# Patient Record
Sex: Female | Born: 1940
Health system: Southern US, Community
[De-identification: ages and names within clinical notes are randomized; demographics above are authoritative.]

## PROBLEM LIST (undated history)

## (undated) DIAGNOSIS — G43019 Migraine without aura, intractable, without status migrainosus: Principal | ICD-10-CM

## (undated) DIAGNOSIS — L80 Vitiligo: Secondary | ICD-10-CM

## (undated) DIAGNOSIS — I10 Essential (primary) hypertension: Secondary | ICD-10-CM

## (undated) DIAGNOSIS — K219 Gastro-esophageal reflux disease without esophagitis: Secondary | ICD-10-CM

## (undated) DIAGNOSIS — R609 Edema, unspecified: Secondary | ICD-10-CM

## (undated) DIAGNOSIS — N200 Calculus of kidney: Secondary | ICD-10-CM

## (undated) DIAGNOSIS — I498 Other specified cardiac arrhythmias: Secondary | ICD-10-CM

## (undated) DIAGNOSIS — E7800 Pure hypercholesterolemia, unspecified: Secondary | ICD-10-CM

## (undated) DIAGNOSIS — E119 Type 2 diabetes mellitus without complications: Secondary | ICD-10-CM

## (undated) HISTORY — DX: Edema, unspecified: R60.9

## (undated) HISTORY — DX: Vitiligo: L80

## (undated) HISTORY — DX: Calculus of kidney: N20.0

## (undated) HISTORY — DX: Type 2 diabetes mellitus without complications: E11.9

## (undated) HISTORY — PX: TONSILLECTOMY: SUR1361

## (undated) HISTORY — DX: Migraine without aura, intractable, without status migrainosus: G43.019

## (undated) HISTORY — PX: NASAL SINUS SURGERY: SHX719

## (undated) HISTORY — PX: BACK SURGERY: SHX140

## (undated) HISTORY — PX: ABDOMINAL HYSTERECTOMY: SHX81

## (undated) HISTORY — DX: Gastro-esophageal reflux disease without esophagitis: K21.9

## (undated) HISTORY — DX: Other specified cardiac arrhythmias: I49.8

---

## 1999-07-14 ENCOUNTER — Encounter: Admission: RE | Admit: 1999-07-14 | Discharge: 1999-10-12 | Payer: Self-pay | Admitting: Internal Medicine

## 2001-02-28 ENCOUNTER — Other Ambulatory Visit: Admission: RE | Admit: 2001-02-28 | Discharge: 2001-02-28 | Payer: Self-pay | Admitting: Dermatology

## 2001-08-03 ENCOUNTER — Ambulatory Visit (HOSPITAL_COMMUNITY): Admission: RE | Admit: 2001-08-03 | Discharge: 2001-08-03 | Payer: Self-pay | Admitting: Internal Medicine

## 2001-08-03 ENCOUNTER — Encounter: Payer: Self-pay | Admitting: Internal Medicine

## 2002-08-14 ENCOUNTER — Ambulatory Visit (HOSPITAL_COMMUNITY): Admission: RE | Admit: 2002-08-14 | Discharge: 2002-08-14 | Payer: Self-pay | Admitting: General Surgery

## 2002-09-20 ENCOUNTER — Encounter: Payer: Self-pay | Admitting: Internal Medicine

## 2002-09-20 ENCOUNTER — Ambulatory Visit (HOSPITAL_COMMUNITY): Admission: RE | Admit: 2002-09-20 | Discharge: 2002-09-20 | Payer: Self-pay | Admitting: Internal Medicine

## 2003-09-24 ENCOUNTER — Ambulatory Visit (HOSPITAL_COMMUNITY): Admission: RE | Admit: 2003-09-24 | Discharge: 2003-09-24 | Payer: Self-pay | Admitting: Internal Medicine

## 2004-09-24 ENCOUNTER — Ambulatory Visit (HOSPITAL_COMMUNITY): Admission: RE | Admit: 2004-09-24 | Discharge: 2004-09-24 | Payer: Self-pay | Admitting: Internal Medicine

## 2005-10-05 ENCOUNTER — Ambulatory Visit (HOSPITAL_COMMUNITY): Admission: RE | Admit: 2005-10-05 | Discharge: 2005-10-05 | Payer: Self-pay | Admitting: Internal Medicine

## 2005-11-22 ENCOUNTER — Emergency Department (HOSPITAL_COMMUNITY): Admission: EM | Admit: 2005-11-22 | Discharge: 2005-11-22 | Payer: Self-pay | Admitting: Emergency Medicine

## 2006-12-27 ENCOUNTER — Ambulatory Visit (HOSPITAL_COMMUNITY): Admission: RE | Admit: 2006-12-27 | Discharge: 2006-12-27 | Payer: Self-pay | Admitting: Internal Medicine

## 2007-12-28 ENCOUNTER — Ambulatory Visit (HOSPITAL_COMMUNITY): Admission: RE | Admit: 2007-12-28 | Discharge: 2007-12-28 | Payer: Self-pay | Admitting: Internal Medicine

## 2008-12-27 ENCOUNTER — Ambulatory Visit (HOSPITAL_COMMUNITY): Admission: RE | Admit: 2008-12-27 | Discharge: 2008-12-27 | Payer: Self-pay | Admitting: Internal Medicine

## 2008-12-31 ENCOUNTER — Ambulatory Visit (HOSPITAL_COMMUNITY): Admission: RE | Admit: 2008-12-31 | Discharge: 2008-12-31 | Payer: Self-pay | Admitting: Internal Medicine

## 2009-02-22 ENCOUNTER — Ambulatory Visit (HOSPITAL_COMMUNITY): Admission: RE | Admit: 2009-02-22 | Discharge: 2009-02-22 | Payer: Self-pay | Admitting: Internal Medicine

## 2009-05-04 HISTORY — PX: COLONOSCOPY: SHX174

## 2009-05-28 ENCOUNTER — Ambulatory Visit (HOSPITAL_COMMUNITY): Admission: RE | Admit: 2009-05-28 | Discharge: 2009-05-28 | Payer: Self-pay | Admitting: General Surgery

## 2010-02-17 ENCOUNTER — Ambulatory Visit (HOSPITAL_COMMUNITY): Admission: RE | Admit: 2010-02-17 | Discharge: 2010-02-17 | Payer: Self-pay | Admitting: Internal Medicine

## 2010-07-20 LAB — GLUCOSE, CAPILLARY: Glucose-Capillary: 98 mg/dL (ref 70–99)

## 2010-09-19 NOTE — H&P (Signed)
   NAMEARNETTE, DRIGGS                             ACCOUNT NO.:  [id number]   MEDICAL RECORD NO.:  [id number]                  PATIENT TYPE:   LOCATION:                                       FACILITY:   PHYSICIAN:  Dalia Heading, M.D.               DATE OF BIRTH:  06-17-1940   DATE OF ADMISSION:  DATE OF DISCHARGE:                                HISTORY & PHYSICAL   CHIEF COMPLAINT:  Need for endoscopic evaluation.   HISTORY OF PRESENT ILLNESS:  The patient is a 70 year old white female who  is referred for endoscopic evaluation.  She needs a follow up colonoscopy  due to a strong family history of colon carcinoma.  She has had intermittent  hematemesis recently.  She denies any lightheadedness, weight loss, fever,  abdominal pain, constipation, diarrhea, or melena.  She also complains of  dysphagia. It occurs intermittently and she is having problems swallowing  food initially in the morning.  She has had emesis of undigested food. No  heartburn has been noted. There is no history of peptic ulcer disease.  Her  last colonoscopy was in 1998.   PAST MEDICAL HISTORY:  Hypertension, extrinsic allergies.   PAST SURGICAL HISTORY:  Hysterectomy.   CURRENT MEDICATIONS:  Altace, furosemide, Premarin, Zyrtec, baby aspirin,  eye drops.   ALLERGIES:  No known drug allergies.   REVIEW OF SYSTEMS:  Noncontributory.   PHYSICAL EXAMINATION:  GENERAL:  On physical examination, the patient is a  well-developed well-nourished white female in no acute distress.  VITAL SIGNS:  She is afebrile and vital signs are stable.  NECK:  Examination is supple without thyromegaly or lymphadenopathy.  LUNGS:  Clear to auscultation with equal breath sounds bilaterally.  HEART:  Heart examination reveals a regular rate and rhythm without S3, S4,  or murmurs.  ABDOMEN:  The abdomen is soft, nontender, nondistended.  No  hepatosplenomegaly, masses are noted.  RECTAL:  Examination was deferred to the  procedure.   IMPRESSION:  1. Dysphagia.  2. Family history of colon carcinoma.    PLAN:  The patient is scheduled for an EGD and colonoscopy on 08/14/2002.  The risks and benefits of the procedure including bleeding and perforation  were fully explained to the patient who gave informed consent.                                               Dalia Heading, M.D.    MAJ/MEDQ  D:  08/08/2002  T:  08/08/2002  Job:  [id number]   cc:   Kingsley Callander. Ouida Sills, M.D.  [address]  [place]  [place] [address]  Fax: [phone]

## 2011-02-12 ENCOUNTER — Other Ambulatory Visit: Payer: Self-pay | Admitting: Obstetrics and Gynecology

## 2011-02-12 DIAGNOSIS — Z139 Encounter for screening, unspecified: Secondary | ICD-10-CM

## 2011-02-24 ENCOUNTER — Ambulatory Visit (HOSPITAL_COMMUNITY)
Admission: RE | Admit: 2011-02-24 | Discharge: 2011-02-24 | Disposition: A | Discharge status: Home / Self Care | Payer: Medicare Other | Source: Ambulatory Visit | Attending: Obstetrics and Gynecology | Admitting: Obstetrics and Gynecology

## 2011-02-24 DIAGNOSIS — Z1231 Encounter for screening mammogram for malignant neoplasm of breast: Secondary | ICD-10-CM | POA: Insufficient documentation

## 2011-02-24 DIAGNOSIS — Z139 Encounter for screening, unspecified: Secondary | ICD-10-CM

## 2011-06-08 DIAGNOSIS — E119 Type 2 diabetes mellitus without complications: Secondary | ICD-10-CM | POA: Diagnosis not present

## 2011-06-16 DIAGNOSIS — I10 Essential (primary) hypertension: Secondary | ICD-10-CM | POA: Diagnosis not present

## 2011-06-16 DIAGNOSIS — E119 Type 2 diabetes mellitus without complications: Secondary | ICD-10-CM | POA: Diagnosis not present

## 2011-07-06 DIAGNOSIS — H2510 Age-related nuclear cataract, unspecified eye: Secondary | ICD-10-CM | POA: Diagnosis not present

## 2011-07-06 DIAGNOSIS — E119 Type 2 diabetes mellitus without complications: Secondary | ICD-10-CM | POA: Diagnosis not present

## 2011-07-06 DIAGNOSIS — H4011X0 Primary open-angle glaucoma, stage unspecified: Secondary | ICD-10-CM | POA: Diagnosis not present

## 2011-09-25 DIAGNOSIS — Z79899 Other long term (current) drug therapy: Secondary | ICD-10-CM | POA: Diagnosis not present

## 2011-09-25 DIAGNOSIS — E119 Type 2 diabetes mellitus without complications: Secondary | ICD-10-CM | POA: Diagnosis not present

## 2011-09-25 DIAGNOSIS — E785 Hyperlipidemia, unspecified: Secondary | ICD-10-CM | POA: Diagnosis not present

## 2011-09-25 DIAGNOSIS — I10 Essential (primary) hypertension: Secondary | ICD-10-CM | POA: Diagnosis not present

## 2011-10-05 DIAGNOSIS — E785 Hyperlipidemia, unspecified: Secondary | ICD-10-CM | POA: Diagnosis not present

## 2011-10-05 DIAGNOSIS — E119 Type 2 diabetes mellitus without complications: Secondary | ICD-10-CM | POA: Diagnosis not present

## 2011-10-05 DIAGNOSIS — I498 Other specified cardiac arrhythmias: Secondary | ICD-10-CM | POA: Diagnosis not present

## 2011-10-05 DIAGNOSIS — R319 Hematuria, unspecified: Secondary | ICD-10-CM | POA: Diagnosis not present

## 2011-10-16 DIAGNOSIS — N390 Urinary tract infection, site not specified: Secondary | ICD-10-CM | POA: Diagnosis not present

## 2011-11-09 DIAGNOSIS — H2510 Age-related nuclear cataract, unspecified eye: Secondary | ICD-10-CM | POA: Diagnosis not present

## 2011-11-09 DIAGNOSIS — E119 Type 2 diabetes mellitus without complications: Secondary | ICD-10-CM | POA: Diagnosis not present

## 2011-11-09 DIAGNOSIS — H4011X0 Primary open-angle glaucoma, stage unspecified: Secondary | ICD-10-CM | POA: Diagnosis not present

## 2012-01-29 DIAGNOSIS — Z79899 Other long term (current) drug therapy: Secondary | ICD-10-CM | POA: Diagnosis not present

## 2012-01-29 DIAGNOSIS — E039 Hypothyroidism, unspecified: Secondary | ICD-10-CM | POA: Diagnosis not present

## 2012-01-29 DIAGNOSIS — E119 Type 2 diabetes mellitus without complications: Secondary | ICD-10-CM | POA: Diagnosis not present

## 2012-02-05 DIAGNOSIS — E119 Type 2 diabetes mellitus without complications: Secondary | ICD-10-CM | POA: Diagnosis not present

## 2012-02-05 DIAGNOSIS — I10 Essential (primary) hypertension: Secondary | ICD-10-CM | POA: Diagnosis not present

## 2012-03-14 DIAGNOSIS — H4011X0 Primary open-angle glaucoma, stage unspecified: Secondary | ICD-10-CM | POA: Diagnosis not present

## 2012-05-07 DIAGNOSIS — R300 Dysuria: Secondary | ICD-10-CM | POA: Diagnosis not present

## 2012-05-07 DIAGNOSIS — N390 Urinary tract infection, site not specified: Secondary | ICD-10-CM | POA: Diagnosis not present

## 2012-05-10 DIAGNOSIS — Z23 Encounter for immunization: Secondary | ICD-10-CM | POA: Diagnosis not present

## 2012-06-06 DIAGNOSIS — E119 Type 2 diabetes mellitus without complications: Secondary | ICD-10-CM | POA: Diagnosis not present

## 2012-06-14 DIAGNOSIS — E119 Type 2 diabetes mellitus without complications: Secondary | ICD-10-CM | POA: Diagnosis not present

## 2012-06-14 DIAGNOSIS — I10 Essential (primary) hypertension: Secondary | ICD-10-CM | POA: Diagnosis not present

## 2012-10-04 DIAGNOSIS — Z79899 Other long term (current) drug therapy: Secondary | ICD-10-CM | POA: Diagnosis not present

## 2012-10-04 DIAGNOSIS — I10 Essential (primary) hypertension: Secondary | ICD-10-CM | POA: Diagnosis not present

## 2012-10-04 DIAGNOSIS — E785 Hyperlipidemia, unspecified: Secondary | ICD-10-CM | POA: Diagnosis not present

## 2012-10-04 DIAGNOSIS — E119 Type 2 diabetes mellitus without complications: Secondary | ICD-10-CM | POA: Diagnosis not present

## 2012-10-10 DIAGNOSIS — Z79899 Other long term (current) drug therapy: Secondary | ICD-10-CM | POA: Diagnosis not present

## 2012-10-10 DIAGNOSIS — E119 Type 2 diabetes mellitus without complications: Secondary | ICD-10-CM | POA: Diagnosis not present

## 2012-10-10 DIAGNOSIS — I10 Essential (primary) hypertension: Secondary | ICD-10-CM | POA: Diagnosis not present

## 2012-10-10 DIAGNOSIS — E785 Hyperlipidemia, unspecified: Secondary | ICD-10-CM | POA: Diagnosis not present

## 2012-10-17 DIAGNOSIS — I10 Essential (primary) hypertension: Secondary | ICD-10-CM | POA: Diagnosis not present

## 2012-10-17 DIAGNOSIS — I498 Other specified cardiac arrhythmias: Secondary | ICD-10-CM | POA: Diagnosis not present

## 2012-10-17 DIAGNOSIS — E119 Type 2 diabetes mellitus without complications: Secondary | ICD-10-CM | POA: Diagnosis not present

## 2012-10-17 DIAGNOSIS — E785 Hyperlipidemia, unspecified: Secondary | ICD-10-CM | POA: Diagnosis not present

## 2012-10-20 ENCOUNTER — Other Ambulatory Visit (HOSPITAL_COMMUNITY): Payer: Self-pay | Admitting: Internal Medicine

## 2012-10-20 DIAGNOSIS — Z139 Encounter for screening, unspecified: Secondary | ICD-10-CM

## 2012-10-25 ENCOUNTER — Ambulatory Visit (HOSPITAL_COMMUNITY)
Admission: RE | Admit: 2012-10-25 | Discharge: 2012-10-25 | Disposition: A | Discharge status: Home / Self Care | Payer: Medicare Other | Source: Ambulatory Visit | Attending: Internal Medicine | Admitting: Internal Medicine

## 2012-10-25 DIAGNOSIS — Z1231 Encounter for screening mammogram for malignant neoplasm of breast: Secondary | ICD-10-CM | POA: Diagnosis not present

## 2012-10-25 DIAGNOSIS — Z139 Encounter for screening, unspecified: Secondary | ICD-10-CM

## 2012-11-11 DIAGNOSIS — H4011X0 Primary open-angle glaucoma, stage unspecified: Secondary | ICD-10-CM | POA: Diagnosis not present

## 2012-11-11 DIAGNOSIS — E119 Type 2 diabetes mellitus without complications: Secondary | ICD-10-CM | POA: Diagnosis not present

## 2012-11-11 DIAGNOSIS — H2510 Age-related nuclear cataract, unspecified eye: Secondary | ICD-10-CM | POA: Diagnosis not present

## 2012-12-02 ENCOUNTER — Other Ambulatory Visit: Payer: Self-pay | Admitting: *Deleted

## 2012-12-02 MED ORDER — CLOBETASOL PROPIONATE 0.05 % EX CREA: TOPICAL_CREAM | CUTANEOUS | Status: DC

## 2013-02-15 DIAGNOSIS — E119 Type 2 diabetes mellitus without complications: Secondary | ICD-10-CM | POA: Diagnosis not present

## 2013-02-22 DIAGNOSIS — E119 Type 2 diabetes mellitus without complications: Secondary | ICD-10-CM | POA: Diagnosis not present

## 2013-02-22 DIAGNOSIS — I10 Essential (primary) hypertension: Secondary | ICD-10-CM | POA: Diagnosis not present

## 2013-02-22 DIAGNOSIS — Z23 Encounter for immunization: Secondary | ICD-10-CM | POA: Diagnosis not present

## 2013-03-15 DIAGNOSIS — H4011X0 Primary open-angle glaucoma, stage unspecified: Secondary | ICD-10-CM | POA: Diagnosis not present

## 2013-03-15 DIAGNOSIS — H2510 Age-related nuclear cataract, unspecified eye: Secondary | ICD-10-CM | POA: Diagnosis not present

## 2013-03-15 DIAGNOSIS — E119 Type 2 diabetes mellitus without complications: Secondary | ICD-10-CM | POA: Diagnosis not present

## 2013-08-04 DIAGNOSIS — H4011X0 Primary open-angle glaucoma, stage unspecified: Secondary | ICD-10-CM | POA: Diagnosis not present

## 2013-08-04 DIAGNOSIS — H2510 Age-related nuclear cataract, unspecified eye: Secondary | ICD-10-CM | POA: Diagnosis not present

## 2013-08-04 DIAGNOSIS — H5340 Unspecified visual field defects: Secondary | ICD-10-CM | POA: Diagnosis not present

## 2013-08-04 DIAGNOSIS — E119 Type 2 diabetes mellitus without complications: Secondary | ICD-10-CM | POA: Diagnosis not present

## 2013-08-20 ENCOUNTER — Encounter (HOSPITAL_COMMUNITY): Payer: Self-pay | Admitting: Emergency Medicine

## 2013-08-20 ENCOUNTER — Emergency Department (HOSPITAL_COMMUNITY)
Admission: EM | Admit: 2013-08-20 | Discharge: 2013-08-21 | Disposition: A | Discharge status: Home / Self Care | Payer: Medicare Other | Attending: Emergency Medicine | Admitting: Emergency Medicine

## 2013-08-20 DIAGNOSIS — F411 Generalized anxiety disorder: Secondary | ICD-10-CM | POA: Diagnosis not present

## 2013-08-20 DIAGNOSIS — I10 Essential (primary) hypertension: Secondary | ICD-10-CM | POA: Insufficient documentation

## 2013-08-20 DIAGNOSIS — R51 Headache: Secondary | ICD-10-CM | POA: Diagnosis not present

## 2013-08-20 DIAGNOSIS — E7800 Pure hypercholesterolemia, unspecified: Secondary | ICD-10-CM | POA: Insufficient documentation

## 2013-08-20 DIAGNOSIS — Z79899 Other long term (current) drug therapy: Secondary | ICD-10-CM | POA: Insufficient documentation

## 2013-08-20 DIAGNOSIS — G8929 Other chronic pain: Secondary | ICD-10-CM | POA: Insufficient documentation

## 2013-08-20 DIAGNOSIS — R519 Headache, unspecified: Secondary | ICD-10-CM

## 2013-08-20 HISTORY — DX: Essential (primary) hypertension: I10

## 2013-08-20 HISTORY — DX: Pure hypercholesterolemia, unspecified: E78.00

## 2013-08-20 MED ORDER — DEXAMETHASONE SODIUM PHOSPHATE 10 MG/ML IJ SOLN
10.0000 mg | Freq: Once | INTRAMUSCULAR | Status: AC
  Administered 2013-08-20: 10 mg via INTRAVENOUS
  Filled 2013-08-20: qty 1

## 2013-08-20 MED ORDER — METOCLOPRAMIDE HCL 5 MG/ML IJ SOLN
10.0000 mg | Freq: Once | INTRAMUSCULAR | Status: AC
  Administered 2013-08-20: 10 mg via INTRAVENOUS
  Filled 2013-08-20: qty 2

## 2013-08-20 MED ORDER — HYDROMORPHONE HCL PF 1 MG/ML IJ SOLN
0.5000 mg | Freq: Once | INTRAMUSCULAR | Status: AC
  Administered 2013-08-21: 0.5 mg via INTRAVENOUS
  Filled 2013-08-20: qty 1

## 2013-08-20 MED ORDER — DIPHENHYDRAMINE HCL 50 MG/ML IJ SOLN
25.0000 mg | Freq: Once | INTRAMUSCULAR | Status: AC
  Administered 2013-08-20: 25 mg via INTRAVENOUS
  Filled 2013-08-20: qty 1

## 2013-08-20 MED ORDER — SODIUM CHLORIDE 0.9 % IV BOLUS (SEPSIS)
1000.0000 mL | Freq: Once | INTRAVENOUS | Status: AC
  Administered 2013-08-20: 1000 mL via INTRAVENOUS

## 2013-08-20 MED ORDER — HYDROMORPHONE HCL PF 1 MG/ML IJ SOLN
0.5000 mg | Freq: Once | INTRAMUSCULAR | Status: AC
  Administered 2013-08-20: 0.5 mg via INTRAVENOUS
  Filled 2013-08-20: qty 1

## 2013-08-20 NOTE — ED Notes (Signed)
Pt c/o of a headache of and on since last week with some nausea. Pt given tramadol. Pt recent went to [place] from March 7th to March 23rd. Pt just says she feels terrible.

## 2013-08-20 NOTE — ED Provider Notes (Signed)
CSN: [id number]     Arrival date & time 08/20/13  2110 History   First MD Initiated Contact with Patient 08/20/13 2130     Chief Complaint  Patient presents with  . Headache     (Consider location/radiation/quality/duration/timing/severity/associated sxs/prior Treatment) HPI 73 year old female with history of chronic intermittent headaches typically gets to severe headaches every year with multiple mild headaches in between episodes of severe headaches; in March travel to [place] but was not sick there at all and did take malaria prophylaxis, before the plane ride back home the patient became anxious and generally weak and shaky like she typically does before flight because she does not like to fly in the plane ride was 18 hours and on the way home she developed a very mild headache during the flight without associated symptoms other than anxiety and general weakness like she typically has one she flies, since she has been home she has had multiple intermittent headaches lasting several hours at a time which were mild but over the last week has become more intense and she saw her doctor and was prescribed Ultram which was controlling the headaches but now today she developed a gradual onset headache for the last several hours the Ultram is not helping, she is no fever no trauma no confusion no rash no body aches no vomiting but does have some mild nausea today she is no diarrhea no cough no chest pain no shortness of breath no abdominal pain she is no change in speech vision swallowing or understanding she has no focal or lateralizing weakness numbness or incoordination and she has no vertigo. She has had vertigo in the past but does not have today and has not had it in the last month. This headache today is similar to the headaches that she gets about twice a year. Past Medical History  Diagnosis Date  . High cholesterol   . Hypertension    Past Surgical History  Procedure Laterality Date  .  Tonsillectomy    . Back surgery    . Nasal sinus surgery    . Abdominal hysterectomy     History reviewed. No pertinent family history. History  Substance Use Topics  . Smoking status: Never Smoker   . Smokeless tobacco: Not on file  . Alcohol Use: No   OB History   Grav Para Term Preterm Abortions TAB SAB Ect Mult Living                 Review of Systems 10 Systems reviewed and are negative for acute change except as noted in the HPI.   Allergies  Codeine  Home Medications   Prior to Admission medications   Medication Sig Start Date End Date Taking? Authorizing Provider  clobetasol cream (TEMOVATE) 0.05 % Use lightly as directed for vulvar irritation 12/02/12   Jonnie Kind, MD  furosemide (LASIX) 40 MG tablet Take 40 mg by mouth daily. 06/27/13   Historical Provider, MD  hydrochlorothiazide (HYDRODIURIL) 25 MG tablet Take 25 mg by mouth daily. 08/15/13   Historical Provider, MD  ramipril (ALTACE) 10 MG capsule Take 10 mg by mouth daily. 06/27/13   Historical Provider, MD  traMADol (ULTRAM) 50 MG tablet Take 50 mg by mouth every 4 (four) hours as needed. For pain 08/11/13   Historical Provider, MD   BP 148/65  Pulse 65  Temp(Src) 98.1 F (36.7 C) (Oral)  Resp 18  Ht 5\' 2"  (1.575 m)  Wt 198 lb (89.812 kg)  BMI 36.21 kg/m2  SpO2 93% Physical Exam  Nursing note and vitals reviewed. Constitutional:  Awake, alert, nontoxic appearance with baseline speech for patient.  HENT:  Head: Atraumatic.  Mouth/Throat: No oropharyngeal exudate.  Eyes: EOM are normal. Pupils are equal, round, and reactive to light. Right eye exhibits no discharge. Left eye exhibits no discharge.  No nystagmus; no vertigo; negative test of skew; head and posttest without saccade  Neck: Neck supple.  Cardiovascular: Normal rate and regular rhythm.   No murmur heard. Pulmonary/Chest: Effort normal and breath sounds normal. No stridor. No respiratory distress. She has no wheezes. She has no rales. She  exhibits no tenderness.  Abdominal: Soft. Bowel sounds are normal. She exhibits no mass. There is no tenderness. There is no rebound.  Musculoskeletal: She exhibits no tenderness.  Baseline ROM, moves extremities with no obvious new focal weakness.  Lymphadenopathy:    She has no cervical adenopathy.  Neurological:  Awake, alert, cooperative and aware of situation; motor strength bilaterally; sensation normal to light touch bilaterally; peripheral visual fields full to confrontation; no facial asymmetry; tongue midline; major cranial nerves appear intact; no pronator drift, normal finger to nose bilaterally, baseline gait without new ataxia.  Skin: No rash noted.  Psychiatric: She has a normal mood and affect.    ED Course  Procedures (including critical care time) Pt feels improved after observation and/or treatment in ED.Patient / Family / Caregiver informed of clinical course, understand medical decision-making process, and agree with plan. Labs Review Labs Reviewed - No data to display  Imagin  MDM   Final diagnoses:  Headache    I doubt any other EMC precluding discharge at this time including, but not necessarily limited to the following:SBI, SAH, CVA.    Babette Relic, MD 08/21/13 2151

## 2013-08-21 MED ORDER — OXYCODONE-ACETAMINOPHEN 5-325 MG PO TABS: 1.0000 | ORAL_TABLET | ORAL | Status: DC | PRN

## 2013-08-21 NOTE — Discharge Instructions (Signed)

## 2013-08-22 DIAGNOSIS — G43009 Migraine without aura, not intractable, without status migrainosus: Secondary | ICD-10-CM | POA: Diagnosis not present

## 2013-08-22 MED FILL — Oxycodone w/ Acetaminophen Tab 5-325 MG: ORAL | Qty: 6 | Status: AC

## 2013-08-24 ENCOUNTER — Emergency Department (HOSPITAL_COMMUNITY): Payer: Medicare Other

## 2013-08-24 ENCOUNTER — Encounter (HOSPITAL_COMMUNITY): Payer: Self-pay | Admitting: Emergency Medicine

## 2013-08-24 ENCOUNTER — Emergency Department (HOSPITAL_COMMUNITY)
Admission: EM | Admit: 2013-08-24 | Discharge: 2013-08-24 | Disposition: A | Discharge status: Home / Self Care | Payer: Medicare Other | Attending: Emergency Medicine | Admitting: Emergency Medicine

## 2013-08-24 DIAGNOSIS — Z862 Personal history of diseases of the blood and blood-forming organs and certain disorders involving the immune mechanism: Secondary | ICD-10-CM | POA: Insufficient documentation

## 2013-08-24 DIAGNOSIS — N390 Urinary tract infection, site not specified: Secondary | ICD-10-CM | POA: Insufficient documentation

## 2013-08-24 DIAGNOSIS — Z79899 Other long term (current) drug therapy: Secondary | ICD-10-CM | POA: Insufficient documentation

## 2013-08-24 DIAGNOSIS — I10 Essential (primary) hypertension: Secondary | ICD-10-CM | POA: Diagnosis not present

## 2013-08-24 DIAGNOSIS — R51 Headache: Secondary | ICD-10-CM | POA: Diagnosis not present

## 2013-08-24 DIAGNOSIS — R110 Nausea: Secondary | ICD-10-CM | POA: Insufficient documentation

## 2013-08-24 DIAGNOSIS — R519 Headache, unspecified: Secondary | ICD-10-CM

## 2013-08-24 DIAGNOSIS — Z8639 Personal history of other endocrine, nutritional and metabolic disease: Secondary | ICD-10-CM | POA: Insufficient documentation

## 2013-08-24 LAB — COMPREHENSIVE METABOLIC PANEL
ALK PHOS: 82 U/L (ref 39–117)
ALT: 19 U/L (ref 0–35)
AST: 21 U/L (ref 0–37)
Albumin: 4 g/dL (ref 3.5–5.2)
BILIRUBIN TOTAL: 0.6 mg/dL (ref 0.3–1.2)
BUN: 22 mg/dL (ref 6–23)
CO2: 32 meq/L (ref 19–32)
CREATININE: 0.85 mg/dL (ref 0.50–1.10)
Calcium: 9.9 mg/dL (ref 8.4–10.5)
Chloride: 98 mEq/L (ref 96–112)
GFR calc Af Amer: 76 mL/min — ABNORMAL LOW (ref >90)
GFR calc non Af Amer: 65 mL/min — ABNORMAL LOW (ref >90)
Glucose, Bld: 183 mg/dL — ABNORMAL HIGH (ref 70–99)
Potassium: 4 mEq/L (ref 3.7–5.3)
Sodium: 140 mEq/L (ref 137–147)
TOTAL PROTEIN: 7.3 g/dL (ref 6.0–8.3)

## 2013-08-24 LAB — URINALYSIS, ROUTINE W REFLEX MICROSCOPIC
BILIRUBIN URINE: NEGATIVE
Glucose, UA: NEGATIVE mg/dL
HGB URINE DIPSTICK: NEGATIVE
KETONES UR: NEGATIVE mg/dL
Nitrite: NEGATIVE
PH: 6 (ref 5.0–8.0)
Protein, ur: NEGATIVE mg/dL
Specific Gravity, Urine: 1.01 (ref 1.005–1.030)
UROBILINOGEN UA: 0.2 mg/dL (ref 0.0–1.0)

## 2013-08-24 LAB — CBC WITH DIFFERENTIAL/PLATELET
BASOS ABS: 0 10*3/uL (ref 0.0–0.1)
Basophils Relative: 0 % (ref 0–1)
EOS PCT: 0 % (ref 0–5)
Eosinophils Absolute: 0 10*3/uL (ref 0.0–0.7)
HCT: 50.4 % — ABNORMAL HIGH (ref 36.0–46.0)
Hemoglobin: 17.4 g/dL — ABNORMAL HIGH (ref 12.0–15.0)
LYMPHS ABS: 0.9 10*3/uL (ref 0.7–4.0)
Lymphocytes Relative: 10 % — ABNORMAL LOW (ref 12–46)
MCH: 30.3 pg (ref 26.0–34.0)
MCHC: 34.5 g/dL (ref 30.0–36.0)
MCV: 87.7 fL (ref 78.0–100.0)
MONO ABS: 0.1 10*3/uL (ref 0.1–1.0)
Monocytes Relative: 1 % — ABNORMAL LOW (ref 3–12)
NEUTROS ABS: 8.3 10*3/uL — AB (ref 1.7–7.7)
NEUTROS PCT: 89 % — AB (ref 43–77)
PLATELETS: 306 10*3/uL (ref 150–400)
RBC: 5.75 MIL/uL — ABNORMAL HIGH (ref 3.87–5.11)
RDW: 13.9 % (ref 11.5–15.5)
WBC: 9.3 10*3/uL (ref 4.0–10.5)

## 2013-08-24 LAB — URINE MICROSCOPIC-ADD ON

## 2013-08-24 MED ORDER — ONDANSETRON 4 MG PO TBDP: ORAL_TABLET | ORAL | Status: DC

## 2013-08-24 MED ORDER — KETOROLAC TROMETHAMINE 30 MG/ML IJ SOLN
30.0000 mg | Freq: Once | INTRAMUSCULAR | Status: AC
  Administered 2013-08-24: 30 mg via INTRAVENOUS
  Filled 2013-08-24: qty 1

## 2013-08-24 MED ORDER — CEPHALEXIN 500 MG PO CAPS: 500.0000 mg | ORAL_CAPSULE | Freq: Four times a day (QID) | ORAL | Status: DC

## 2013-08-24 MED ORDER — CEPHALEXIN 500 MG PO CAPS
500.0000 mg | ORAL_CAPSULE | Freq: Once | ORAL | Status: AC
  Administered 2013-08-24: 500 mg via ORAL
  Filled 2013-08-24: qty 1

## 2013-08-24 MED ORDER — HYDROMORPHONE HCL PF 1 MG/ML IJ SOLN
1.0000 mg | Freq: Once | INTRAMUSCULAR | Status: AC
  Administered 2013-08-24: 1 mg via INTRAVENOUS
  Filled 2013-08-24: qty 1

## 2013-08-24 MED ORDER — METOCLOPRAMIDE HCL 5 MG/ML IJ SOLN
10.0000 mg | Freq: Once | INTRAMUSCULAR | Status: AC
  Administered 2013-08-24: 10 mg via INTRAVENOUS
  Filled 2013-08-24: qty 2

## 2013-08-24 MED ORDER — DIPHENHYDRAMINE HCL 50 MG/ML IJ SOLN
25.0000 mg | Freq: Once | INTRAMUSCULAR | Status: AC
  Administered 2013-08-24: 25 mg via INTRAVENOUS
  Filled 2013-08-24: qty 1

## 2013-08-24 NOTE — Discharge Instructions (Signed)
Follow up with your md next week. °

## 2013-08-24 NOTE — ED Notes (Signed)
Pt given and encouraged to drink water

## 2013-08-24 NOTE — ED Provider Notes (Signed)
CSN: [id number]     Arrival date & time 08/24/13  1650 History  This chart was scribed for Maudry Diego, MD by Zettie Pho, ED Scribe. This patient was seen in room APA05/[facility]05 and the patient's care was started at 6:51 PM.    Chief Complaint  Patient presents with  . Headache   Patient is a 73 y.o. female presenting with headaches. The history is provided by the patient. No language interpreter was used.  Headache Pain location:  Generalized Onset quality:  Gradual Timing:  Constant Progression:  Worsening Chronicity:  Recurrent Relieved by:  Nothing Worsened by:  Nothing tried Ineffective treatments:  Prescription medications (Prednisone, Percocet, Tramadol) Associated symptoms: nausea   Associated symptoms: no abdominal pain, no back pain, no congestion, no cough, no diarrhea, no fatigue, no seizures and no sinus pressure    HPI Comments: Kimberly Miranda is a 73 y.o. female who presents to the Emergency Department complaining of a constant, diffuse headache with associated nausea onset 4 days ago that she states has been progressively worsening. She states that she followed up with her PCP for similar complaints and was given prednisone and Tramadol, but without relief of her symptoms. Patient was also seen here 5 days ago on 08/20/2013 for similar complaints and given Percocet, which she also states has not been effective at alleviating her symptoms. She states that she returned from a trip to [place] about a month ago and has been "feeling off" since then. Patient has an allergy to codeine. Patient has a history of hypercholesterolemia and HTN.   PCP- Dr. Asencion Noble  Past Medical History  Diagnosis Date  . High cholesterol   . Hypertension    Past Surgical History  Procedure Laterality Date  . Tonsillectomy    . Back surgery    . Nasal sinus surgery    . Abdominal hysterectomy     History reviewed. No pertinent family history. History  Substance Use Topics  . Smoking  status: Never Smoker   . Smokeless tobacco: Not on file  . Alcohol Use: No   OB History   Grav Para Term Preterm Abortions TAB SAB Ect Mult Living                 Review of Systems  Constitutional: Negative for appetite change and fatigue.  HENT: Negative for congestion, ear discharge and sinus pressure.   Eyes: Negative for discharge.  Respiratory: Negative for cough.   Cardiovascular: Negative for chest pain.  Gastrointestinal: Positive for nausea. Negative for abdominal pain and diarrhea.  Genitourinary: Negative for frequency and hematuria.  Musculoskeletal: Negative for back pain.  Skin: Negative for rash.  Neurological: Positive for headaches. Negative for seizures.  Psychiatric/Behavioral: Negative for hallucinations.      Allergies  Codeine  Home Medications   Prior to Admission medications   Medication Sig Start Date End Date Taking? Authorizing Provider  clobetasol cream (TEMOVATE) 0.05 % Use lightly as directed for vulvar irritation 12/02/12   Jonnie Kind, MD  furosemide (LASIX) 40 MG tablet Take 40 mg by mouth daily. 06/27/13   Historical Provider, MD  hydrochlorothiazide (HYDRODIURIL) 25 MG tablet Take 25 mg by mouth daily. 08/15/13   Historical Provider, MD  oxyCODONE-acetaminophen (PERCOCET) 5-325 MG per tablet Take 1 tablet by mouth every 4 (four) hours as needed for severe pain. Dispense to go. 08/21/13   Babette Relic, MD  ramipril (ALTACE) 10 MG capsule Take 10 mg by mouth daily. 06/27/13  Historical Provider, MD  traMADol (ULTRAM) 50 MG tablet Take 50 mg by mouth every 4 (four) hours as needed. For pain 08/11/13   Historical Provider, MD   Triage Vitals: BP 166/72  Pulse 64  Temp(Src) 98.1 F (36.7 C) (Oral)  Resp 16  Ht 5\' 2"  (1.575 m)  Wt 198 lb (89.812 kg)  BMI 36.21 kg/m2  SpO2 96%  Physical Exam  Constitutional: She is oriented to person, place, and time. She appears well-developed.  HENT:  Head: Normocephalic.  Eyes: Conjunctivae and EOM  are normal. No scleral icterus.  Neck: Neck supple. No thyromegaly present.  Cardiovascular: Normal rate and regular rhythm.  Exam reveals no gallop and no friction rub.   No murmur heard. Pulmonary/Chest: No stridor. She has no wheezes. She has no rales. She exhibits no tenderness.  Abdominal: She exhibits no distension. There is no tenderness. There is no rebound.  Musculoskeletal: Normal range of motion. She exhibits no edema.  Lymphadenopathy:    She has no cervical adenopathy.  Neurological: She is oriented to person, place, and time. She exhibits normal muscle tone. Coordination normal.  Skin: No rash noted. No erythema.  Psychiatric: She has a normal mood and affect. Her behavior is normal.    ED Course  Procedures (including critical care time)  DIAGNOSTIC STUDIES: Oxygen Saturation is 96% on room air, normal by my interpretation.    COORDINATION OF CARE: 6:55 PM- Ordered a head CT, CBC, CMP, and UA. Ordered Reglan, Toradol, and Benadryl to manage symptoms. Discussed treatment plan with patient at bedside and patient verbalized agreement.   9:30 PM- Patient reports feeling much better after receiving the medications. Discussed that lab results may indicate a bladder infection, but that CT results were normal. Will discharge patient with anti-nausea medication and antibiotics to manage symptoms. Advised patient to follow up with her PCP if symptoms do not improve in a few days with treatment. Discussed treatment plan with patient at bedside and patient verbalized agreement.    Labs Review Labs Reviewed  CBC WITH DIFFERENTIAL - Abnormal; Notable for the following:    RBC 5.75 (*)    Hemoglobin 17.4 (*)    HCT 50.4 (*)    Neutrophils Relative % 89 (*)    Neutro Abs 8.3 (*)    Lymphocytes Relative 10 (*)    Monocytes Relative 1 (*)    All other components within normal limits  COMPREHENSIVE METABOLIC PANEL - Abnormal; Notable for the following:    Glucose, Bld 183 (*)     GFR calc non Af Amer 65 (*)    GFR calc Af Amer 76 (*)    All other components within normal limits  URINALYSIS, ROUTINE W REFLEX MICROSCOPIC - Abnormal; Notable for the following:    Leukocytes, UA MODERATE (*)    All other components within normal limits  URINE MICROSCOPIC-ADD ON - Abnormal; Notable for the following:    Squamous Epithelial / LPF FEW (*)    Bacteria, UA FEW (*)    All other components within normal limits    Imaging Review Ct Head Wo Contrast  08/24/2013   CLINICAL DATA:  Headache x1 month  EXAM: CT HEAD WITHOUT CONTRAST  TECHNIQUE: Contiguous axial images were obtained from the base of the skull through the vertex without intravenous contrast.  COMPARISON:  CT SINUS LTD W/O CM dated 02/22/2009  FINDINGS: No acute intracranial hemorrhage. No focal mass lesion. No CT evidence of acute infarction. No midline shift or mass effect. No  hydrocephalus. Basilar cisterns are patent. There several subcortical white matter hypodensities. Paranasal sinuses and mastoid air cells are clear.  IMPRESSION: 1. Mild microvascular disease. 2. No acute findings.   Electronically Signed   By: Suzy Bouchard M.D.   On: 08/24/2013 19:58     EKG Interpretation None      MDM   Final diagnoses:  None    The chart was scribed for me under my direct supervision.  I personally performed the history, physical, and medical decision making and all procedures in the evaluation of this patient.Maudry Diego, MD 08/24/13 2141

## 2013-08-24 NOTE — ED Notes (Addendum)
Headache for over one month.  Nausea.  Seen here Sunday night. Family concerned because she just traveled from [place] 21 days ago.

## 2013-08-28 ENCOUNTER — Emergency Department (HOSPITAL_COMMUNITY): Payer: Medicare Other

## 2013-08-28 ENCOUNTER — Emergency Department (HOSPITAL_COMMUNITY)
Admission: EM | Admit: 2013-08-28 | Discharge: 2013-08-29 | Disposition: A | Discharge status: Home / Self Care | Payer: Medicare Other | Attending: Emergency Medicine | Admitting: Emergency Medicine

## 2013-08-28 ENCOUNTER — Encounter (HOSPITAL_COMMUNITY): Payer: Self-pay | Admitting: Emergency Medicine

## 2013-08-28 DIAGNOSIS — R51 Headache: Secondary | ICD-10-CM | POA: Insufficient documentation

## 2013-08-28 DIAGNOSIS — Z79899 Other long term (current) drug therapy: Secondary | ICD-10-CM | POA: Diagnosis not present

## 2013-08-28 DIAGNOSIS — R110 Nausea: Secondary | ICD-10-CM | POA: Diagnosis not present

## 2013-08-28 DIAGNOSIS — E119 Type 2 diabetes mellitus without complications: Secondary | ICD-10-CM | POA: Insufficient documentation

## 2013-08-28 DIAGNOSIS — Z872 Personal history of diseases of the skin and subcutaneous tissue: Secondary | ICD-10-CM | POA: Diagnosis not present

## 2013-08-28 DIAGNOSIS — I10 Essential (primary) hypertension: Secondary | ICD-10-CM | POA: Diagnosis not present

## 2013-08-28 DIAGNOSIS — R29818 Other symptoms and signs involving the nervous system: Secondary | ICD-10-CM | POA: Diagnosis not present

## 2013-08-28 DIAGNOSIS — Z87442 Personal history of urinary calculi: Secondary | ICD-10-CM | POA: Diagnosis not present

## 2013-08-28 DIAGNOSIS — G43019 Migraine without aura, intractable, without status migrainosus: Secondary | ICD-10-CM | POA: Diagnosis not present

## 2013-08-28 DIAGNOSIS — Z8719 Personal history of other diseases of the digestive system: Secondary | ICD-10-CM | POA: Diagnosis not present

## 2013-08-28 DIAGNOSIS — R5381 Other malaise: Secondary | ICD-10-CM | POA: Diagnosis not present

## 2013-08-28 DIAGNOSIS — R5383 Other fatigue: Secondary | ICD-10-CM | POA: Diagnosis not present

## 2013-08-28 DIAGNOSIS — Z792 Long term (current) use of antibiotics: Secondary | ICD-10-CM | POA: Insufficient documentation

## 2013-08-28 DIAGNOSIS — Z7982 Long term (current) use of aspirin: Secondary | ICD-10-CM | POA: Insufficient documentation

## 2013-08-28 DIAGNOSIS — R519 Headache, unspecified: Secondary | ICD-10-CM

## 2013-08-28 MED ORDER — OXYCODONE-ACETAMINOPHEN 5-325 MG PO TABS
1.0000 | ORAL_TABLET | Freq: Once | ORAL | Status: AC
  Administered 2013-08-29: 1 via ORAL
  Filled 2013-08-28: qty 1

## 2013-08-28 MED ORDER — OXYCODONE-ACETAMINOPHEN 5-325 MG PO TABS: 1.0000 | ORAL_TABLET | ORAL | Status: DC | PRN

## 2013-08-28 NOTE — ED Notes (Signed)
Patient c/o headaches starting March 21st the days she left [place]. Patient endorses nausea with headaches with one episode of vomiting today. Patient sts today and Thursday pain was the worst ever. Pt denies weakness to one side, slurred speech, dizziness, blurry vision, CP, SOB, abdominal pain and diarrhea. Patient laying in bed in hallway with daughter in chair at foot of bed, Patient able to speak full sentences without difficulty.

## 2013-08-28 NOTE — ED Notes (Signed)
PT LAST TOOK A PERCOCET AT 7092. TOOK ZOFRAN PRIOR TO COMING.

## 2013-08-28 NOTE — ED Provider Notes (Signed)
Complains of occipital headache onset 3-4 weeks ago. Grams of onset pain is worse with standing improved with lying supine. No visual changes no fever no trauma she's been treated herself with tramadol which initially helped the pain and subsequently with Percocet with partial relief. She has run out of Percocet. On Patient is alert Glasgow Coma Score 15 HEENT exam no facial asymmetry is normal. Fundi benign. Neck supple. Neurologic Glasgow Coma Score 15 motor strength 5 over 5 overall gait normal DTRs symmetric bilaterally at knee jerk ankle jerk and biceps toes downward going bilaterally  Orlie Dakin, MD 08/28/13 2352

## 2013-08-28 NOTE — ED Provider Notes (Signed)
CSN: [id number]     Arrival date & time 08/28/13  1812 History   First MD Initiated Contact with Patient 08/28/13 2028     Chief Complaint  Patient presents with  . Headache     (Consider location/radiation/quality/duration/timing/severity/associated sxs/prior Treatment) HPI Comments: Patient states, that she has had a global headache intermittently since arriving back from a tract of her, March 25th or so.  She's been seen 3 times for, headache.  She's been given a headache cocktail, twice, which states has helped her pain for 2-3, days, but then it returns, the last evaluation by Dr. Eddie Dibbles was 4, days ago, at [facility] at which time she had a CT scan of her head and lab work, all of which was normal.  She was then seen by her primary care physician, Dr. Ria Comment to refer her to neurology as well as obtain an outpatient MRI.  This has not been successful to this point in time. She states, when she changes position.  She has increased pain in her head and prefers lying flat.  Denies any visual disturbances.  She does report nausea, and when ambulating.  Feels, like she has to hold onto things, although she is not ataxic by observation   Patient is a 73 y.o. female presenting with headaches. The history is provided by the patient.  Headache Pain location:  Generalized Radiates to:  Does not radiate Severity currently:  9/10 Severity at highest:  10/10 Onset quality:  Gradual Timing:  Intermittent Progression:  Unchanged Chronicity:  Recurrent Similar to prior headaches: yes   Relieved by:  Nothing Ineffective treatments: percocet. Associated symptoms: loss of balance, myalgias and nausea   Associated symptoms: no back pain, no blurred vision, no dizziness, no ear pain, no pain, no fever, no focal weakness, no neck pain, no neck stiffness, no paresthesias, no photophobia, no sinus pressure, no syncope and no weakness   Nausea:    Severity:  Mild   Onset quality:  Unable to specify    Timing:  Intermittent Risk factors: no anger, no family hx of SAH, does not have insomnia and lifestyle not sedentary     Past Medical History  Diagnosis Date  . High cholesterol   . Hypertension    Past Surgical History  Procedure Laterality Date  . Tonsillectomy    . Back surgery    . Nasal sinus surgery    . Abdominal hysterectomy     No family history on file. History  Substance Use Topics  . Smoking status: Never Smoker   . Smokeless tobacco: Not on file  . Alcohol Use: No   OB History   Grav Para Term Preterm Abortions TAB SAB Ect Mult Living                 Review of Systems  Unable to perform ROS Constitutional: Negative for fever and chills.  HENT: Negative for ear discharge, ear pain, rhinorrhea, sinus pressure and sneezing.   Eyes: Negative for blurred vision, photophobia and pain.  Respiratory: Negative for shortness of breath.   Cardiovascular: Negative for chest pain and syncope.  Gastrointestinal: Positive for nausea.  Endocrine: Negative for polydipsia, polyphagia and polyuria.  Genitourinary: Negative for dysuria.  Musculoskeletal: Positive for myalgias. Negative for back pain, neck pain and neck stiffness.  Skin: Negative for rash and wound.  Neurological: Positive for headaches and loss of balance. Negative for dizziness, focal weakness and paresthesias.  All other systems reviewed and are negative.  Allergies  Codeine  Home Medications   Prior to Admission medications   Medication Sig Start Date End Date Taking? Authorizing Provider  aspirin EC 81 MG tablet Take 81 mg by mouth daily.    Historical Provider, MD  bimatoprost (LUMIGAN) 0.03 % ophthalmic solution Place 1 drop into both eyes at bedtime.    Historical Provider, MD  butalbital-aspirin-caffeine [facility]) 50-325-40 MG per tablet Take 1 tablet by mouth every 4 (four) hours as needed. For pain 08/24/13   Historical Provider, MD  cephALEXin (KEFLEX) 500 MG capsule Take 1 capsule (500  mg total) by mouth 4 (four) times daily. 08/24/13   Maudry Diego, MD  clobetasol cream (TEMOVATE) 0.05 % Use lightly as directed for vulvar irritation 12/02/12   Jonnie Kind, MD  CRANBERRY FRUIT PO Take 1 capsule by mouth daily.    Historical Provider, MD  furosemide (LASIX) 40 MG tablet Take 40 mg by mouth once a week.  06/27/13   Historical Provider, MD  hydrochlorothiazide (HYDRODIURIL) 25 MG tablet Take 25 mg by mouth daily. 08/15/13   Historical Provider, MD  ondansetron (ZOFRAN ODT) 4 MG disintegrating tablet 4mg  ODT q4 hours prn nausea/vomit 08/24/13   Maudry Diego, MD  oxyCODONE-acetaminophen (PERCOCET) 5-325 MG per tablet Take 1 tablet by mouth every 4 (four) hours as needed for severe pain. Dispense to go. 08/21/13   Babette Relic, MD  predniSONE (DELTASONE) 10 MG tablet Take 10 mg by mouth See admin instructions. Take 4 tablets for 2 days, then 3 tablets for 2 days, then 2 tablets for 2 days, then 1 tablet for 2 days then stop.    Historical Provider, MD  ramipril (ALTACE) 10 MG capsule Take 10 mg by mouth daily. 06/27/13   Historical Provider, MD   BP 152/69  Pulse 62  Temp(Src) 97.2 F (36.2 C) (Oral)  Resp 16  SpO2 96% Physical Exam  Constitutional: She appears well-developed and well-nourished.  HENT:  Head: Normocephalic.  Eyes: Pupils are equal, round, and reactive to light.  Cardiovascular: Normal rate and regular rhythm.   Pulmonary/Chest: Effort normal and breath sounds normal.  Abdominal: Soft. Bowel sounds are normal. She exhibits no distension. There is no tenderness.  Musculoskeletal: Normal range of motion. She exhibits no edema and no tenderness.  Neurological: She is alert. She has normal strength. She displays normal reflexes. No cranial nerve deficit or sensory deficit. Coordination and gait normal. GCS eye subscore is 4. GCS verbal subscore is 5. GCS motor subscore is 6. She displays no Babinski's sign on the right side. She displays Babinski's sign on the  left side.  Reflex Scores:      Patellar reflexes are 2+ on the right side and 2+ on the left side.      Achilles reflexes are 2+ on the right side and 2+ on the left side. Gait is steady.  There is no hesitancy in moving lower extremities.  There is no foot drop  Skin: No rash noted. No erythema. No pallor.    ED Course  Procedures (including critical care time) Labs Review Labs Reviewed - No data to display  Imaging Review Mr Brain Wo Contrast  08/28/2013   CLINICAL DATA:  HEADACHE  EXAM: MRI HEAD WITHOUT CONTRAST  TECHNIQUE: Multiplanar, multiecho pulse sequences of the brain and surrounding structures were obtained without intravenous contrast.  COMPARISON:  CT HEAD W/O CM dated 08/24/2013  FINDINGS: No reduced diffusion to suggest acute ischemia. There is susceptibility artifact to suggest  hemorrhage.  The ventricles and sulci are normal. A few scattered subcentimeter supratentorial white matter T2 hyperintensities are less than expected for age and though nonspecific could reflect chronic small vessel ischemic disease. No midline shift or mass effect. No abnormal intracranial intrinsic T1 shortening.  No abnormal extra-axial fluid collections. Normal major intracranial vascular flow voids seen at the skull base.  Small right maxillary mucosal retention cyst without paranasal sinus air-fluid levels. Mastoid air cells appear well-aerated.  Ocular globes and orbital contents are nonsuspicious though not tailored for evaluation. No abnormal sellar expansion. No cerebellar tonsillar ectopia.  IMPRESSION: No acute intracranial process. Normal noncontrast MRI of the brain for age.   Electronically Signed   By: Elon Alas   On: 08/28/2013 23:26     EKG Interpretation None      MDM  I reviewed the patient's MRI , which shows no stroke, lesions, masses, or any other pathology to contribute to the patient's symptoms  Will renew Rx for Percocet and have patient FU with PCP Final diagnoses:   Headache         Garald Balding, NP 08/28/13 2355

## 2013-08-28 NOTE — Discharge Instructions (Signed)
Headaches, Frequently Asked Questions MIGRAINE HEADACHES Q: What is migraine? What causes it? How can I treat it? A: Generally, migraine headaches begin as a dull ache. Then they develop into a constant, throbbing, and pulsating pain. You may experience pain at the temples. You may experience pain at the front or back of one or both sides of the head. The pain is usually accompanied by a combination of:  Nausea.  Vomiting.  Sensitivity to light and noise. Some people (about 15%) experience an aura (see below) before an attack. The cause of migraine is believed to be chemical reactions in the brain. Treatment for migraine may include over-the-counter or prescription medications. It may also include self-help techniques. These include relaxation training and biofeedback.  Q: What is an aura? A: About 15% of people with migraine get an "aura". This is a sign of neurological symptoms that occur before a migraine headache. You may see wavy or jagged lines, dots, or flashing lights. You might experience tunnel vision or blind spots in one or both eyes. The aura can include visual or auditory hallucinations (something imagined). It may include disruptions in smell (such as strange odors), taste or touch. Other symptoms include:  Numbness.  A "pins and needles" sensation.  Difficulty in recalling or speaking the correct word. These neurological events may last as long as 60 minutes. These symptoms will fade as the headache begins. Q: What is a trigger? A: Certain physical or environmental factors can lead to or "trigger" a migraine. These include:  Foods.  Hormonal changes.  Weather.  Stress. It is important to remember that triggers are different for everyone. To help prevent migraine attacks, you need to figure out which triggers affect you. Keep a headache diary. This is a good way to track triggers. The diary will help you talk to your healthcare professional about your condition. Q: Does  weather affect migraines? A: Bright sunshine, hot, humid conditions, and drastic changes in barometric pressure may lead to, or "trigger," a migraine attack in some people. But studies have shown that weather does not act as a trigger for everyone with migraines. Q: What is the link between migraine and hormones? A: Hormones start and regulate many of your body's functions. Hormones keep your body in balance within a constantly changing environment. The levels of hormones in your body are unbalanced at times. Examples are during menstruation, pregnancy, or menopause. That can lead to a migraine attack. In fact, about three quarters of all women with migraine report that their attacks are related to the menstrual cycle.  Q: Is there an increased risk of stroke for migraine sufferers? A: The likelihood of a migraine attack causing a stroke is very remote. That is not to say that migraine sufferers cannot have a stroke associated with their migraines. In persons under age 53, the most common associated factor for stroke is migraine headache. But over the course of a person's normal life span, the occurrence of migraine headache may actually be associated with a reduced risk of dying from cerebrovascular disease due to stroke.  Q: What are acute medications for migraine? A: Acute medications are used to treat the pain of the headache after it has started. Examples over-the-counter medications, NSAIDs, ergots, and triptans.  Q: What are the triptans? A: Triptans are the newest class of abortive medications. They are specifically targeted to treat migraine. Triptans are vasoconstrictors. They moderate some chemical reactions in the brain. The triptans work on receptors in your brain. Triptans help  to restore the balance of a neurotransmitter called serotonin. Fluctuations in levels of serotonin are thought to be a main cause of migraine.  °Q: Are over-the-counter medications for migraine effective? °A:  Over-the-counter, or "OTC," medications may be effective in relieving mild to moderate pain and associated symptoms of migraine. But you should see your caregiver before beginning any treatment regimen for migraine.  °Q: What are preventive medications for migraine? °A: Preventive medications for migraine are sometimes referred to as "prophylactic" treatments. They are used to reduce the frequency, severity, and length of migraine attacks. Examples of preventive medications include antiepileptic medications, antidepressants, beta-blockers, calcium channel blockers, and NSAIDs (nonsteroidal anti-inflammatory drugs). °Q: Why are anticonvulsants used to treat migraine? °A: During the past few years, there has been an increased interest in antiepileptic drugs for the prevention of migraine. They are sometimes referred to as "anticonvulsants". Both epilepsy and migraine may be caused by similar reactions in the brain.  °Q: Why are antidepressants used to treat migraine? °A: Antidepressants are typically used to treat people with depression. They may reduce migraine frequency by regulating chemical levels, such as serotonin, in the brain.  °Q: What alternative therapies are used to treat migraine? °A: The term "alternative therapies" is often used to describe treatments considered outside the scope of conventional Western medicine. Examples of alternative therapy include acupuncture, acupressure, and yoga. Another common alternative treatment is herbal therapy. Some herbs are believed to relieve headache pain. Always discuss alternative therapies with your caregiver before proceeding. Some herbal products contain arsenic and other toxins. °TENSION HEADACHES °Q: What is a tension-type headache? What causes it? How can I treat it? °A: Tension-type headaches occur randomly. They are often the result of temporary stress, anxiety, fatigue, or anger. Symptoms include soreness in your temples, a tightening band-like sensation  around your head (a "vice-like" ache). Symptoms can also include a pulling feeling, pressure sensations, and contracting head and neck muscles. The headache begins in your forehead, temples, or the back of your head and neck. Treatment for tension-type headache may include over-the-counter or prescription medications. Treatment may also include self-help techniques such as relaxation training and biofeedback. °CLUSTER HEADACHES °Q: What is a cluster headache? What causes it? How can I treat it? °A: Cluster headache gets its name because the attacks come in groups. The pain arrives with little, if any, warning. It is usually on one side of the head. A tearing or bloodshot eye and a runny nose on the same side of the headache may also accompany the pain. Cluster headaches are believed to be caused by chemical reactions in the brain. They have been described as the most severe and intense of any headache type. Treatment for cluster headache includes prescription medication and oxygen. °SINUS HEADACHES °Q: What is a sinus headache? What causes it? How can I treat it? °A: When a cavity in the bones of the face and skull (a sinus) becomes inflamed, the inflammation will cause localized pain. This condition is usually the result of an allergic reaction, a tumor, or an infection. If your headache is caused by a sinus blockage, such as an infection, you will probably have a fever. An x-ray will confirm a sinus blockage. Your caregiver's treatment might include antibiotics for the infection, as well as antihistamines or decongestants.  °REBOUND HEADACHES °Q: What is a rebound headache? What causes it? How can I treat it? °A: A pattern of taking acute headache medications too often can lead to a condition known as "rebound headache."   A pattern of taking too much headache medication includes taking it more than 2 days per week or in excessive amounts. That means more than the label or a caregiver advises. With rebound  headaches, your medications not only stop relieving pain, they actually begin to cause headaches. Doctors treat rebound headache by tapering the medication that is being overused. Sometimes your caregiver will gradually substitute a different type of treatment or medication. Stopping may be a challenge. Regularly overusing a medication increases the potential for serious side effects. Consult a caregiver if you regularly use headache medications more than 2 days per week or more than the label advises. ADDITIONAL QUESTIONS AND ANSWERS Q: What is biofeedback? A: Biofeedback is a self-help treatment. Biofeedback uses special equipment to monitor your body's involuntary physical responses. Biofeedback monitors:  Breathing.  Pulse.  Heart rate.  Temperature.  Muscle tension.  Brain activity. Biofeedback helps you refine and perfect your relaxation exercises. You learn to control the physical responses that are related to stress. Once the technique has been mastered, you do not need the equipment any more. Q: Are headaches hereditary? A: Four out of five (80%) of people that suffer report a family history of migraine. Scientists are not sure if this is genetic or a family predisposition. Despite the uncertainty, a child has a 50% chance of having migraine if one parent suffers. The child has a 75% chance if both parents suffer.  Q: Can children get headaches? A: By the time they reach high school, most young people have experienced some type of headache. Many safe and effective approaches or medications can prevent a headache from occurring or stop it after it has begun.  Q: What type of doctor should I see to diagnose and treat my headache? A: Start with your primary caregiver. Discuss his or her experience and approach to headaches. Discuss methods of classification, diagnosis, and treatment. Your caregiver may decide to recommend you to a headache specialist, depending upon your symptoms or other  physical conditions. Having diabetes, allergies, etc., may require a more comprehensive and inclusive approach to your headache. The National Head[employer] Foundation will provide, upon request, a list of [facility] physician members in your state. Document Released: 07/11/2003 Document Revised: 07/13/2011 Document Reviewed: 12/19/2007 [facility] Patient Information 2014 [place].  Headache and Arthritis Headaches and arthritis are common problems. This causes an interest in the possible role of arthritis in causing headaches. Several major forms of arthritis exist. Two of the most common types are:  Rheumatoid arthritis.  Osteoarthritis. Rheumatoid arthritis may begin at any age. It is a condition in which the body attacks some of its own tissues, thinking they do not belong. This leads to destruction of the bony areas around the joints. This condition may afflict any of the body's joints. It usually produces a deformity of the joint. The hands and fingers no longer appear straight but often appear angled towards one side. In some cases, the spine may be involved. Most often it is the vertebrae of the neck (cervicalspine). The areas of the neck most commonly afflicted by rheumatoid arthritis are the first and second cervical vertebrae. Curiously, rheumatoid arthritis, though it often produces severe deformities, is not always painful.  The more common form of arthritis is osteoarthritis. It is a wear-and-tear form of arthritis. It usually does not produce deformity of the joints or destruction of the bony tissues. Rather the ligaments weaken. They may be calcified due to the body's attempt to heal the damage. The  larger joints of the body and those joints that take the most stress and strain are the most often affected. In the neck region this osteoarthritis usually involves the fifth, sixth and seventh vertebrae. This is because the effects of posture produce the most fatigue on them. Osteoarthritis is often  more painful than rheumatoid arthritis.  During workups for arthritis, a test evaluating inflammation, (the sedimentation rate) often is performed. In rheumatoid arthritis, this test will usually be elevated. Other tests for inflammation may also be elevated. In patients with osteoarthritis, x-rays of the neck or jaw joints will show changes from "lipping" of the vertebrae. This is caused by calcium deposits in the ligaments. Or they may show narrowing of the space between the vertebrae, or spur formation (from calcium deposits). If severe, it may cause obstruction of the holes where the nerves pass from the spine to the body. In rheumatoid arthritis, dislocation of vertebrae may occur in the upper neck. CT scan and MRI in patients with osteoarthritis may show bulging of the discs that cushion the vertebrae. In the most severe cases, herniation of the discs may occur.  Headaches, felt as a pain in the neck, may be caused by arthritis if the first, second or third vertebrae are involved. This condition is due to the nerves that supply the scalp only originating from this area of the spine. Neck pain itself, whether alone or coupled with headaches, can involve any portion of the neck. If the jaw is involved, the symptoms are similar to those of Temporomandibular Joint Syndrome (TMJ).  The progressive severity of rheumatoid arthritis may be slowed by a variety of potent medications. In osteoarthritis, its progression is not usually hindered by medication. The following may be helpful in slowing the advancement of the disorder:  Lifestyle adjustment.  Exercise.  Rest.  Weight loss. Medications, such as the nonsteroidal anti-inflammatory agents (NSAIDs), are useful. They may reduce the pain and improve the reduced motion which occurs in joints afflicted by arthritis. From some studies, the use of acetaminophen appears to be as effective in controlling the pain of arthritis as the NSAIDs. Physical modalities  may also be useful for arthritis. They include:  Heat.  Massage.  Exercise. But physical therapy must be prescribed by a caregiver, just as most medications for arthritis.  Document Released: 07/11/2003 Document Revised: 07/13/2011 Document Reviewed: 12/08/2007 [facility] Patient Information 2014 [place], [place]. Your MRI is normal Please call the neurologist in the morning and daily until you get an appointment

## 2013-08-28 NOTE — ED Notes (Signed)
MD at bedside. 

## 2013-08-28 NOTE — ED Notes (Signed)
Pt reports headache for 3-4 weeks. Reports has to lay down constantly, has been nauseated but is not now. Pt reports she has been to [place] and got a CT scan but she needs an MRI. Also, pt is trying to get in with a neurologist. Pt is a x 4.  States hasn't been eating a lot lately.

## 2013-08-29 ENCOUNTER — Encounter: Payer: Self-pay | Admitting: Neurology

## 2013-08-29 ENCOUNTER — Encounter: Payer: Self-pay | Admitting: *Deleted

## 2013-08-29 NOTE — ED Provider Notes (Signed)
Medical screening examination/treatment/procedure(s) were conducted as a shared visit with non-physician practitioner(s) and myself.  I personally evaluated the patient during the encounter.   EKG Interpretation None       Orlie Dakin, MD 08/29/13 0000

## 2013-08-31 ENCOUNTER — Encounter: Payer: Self-pay | Admitting: Neurology

## 2013-08-31 ENCOUNTER — Ambulatory Visit (INDEPENDENT_AMBULATORY_CARE_PROVIDER_SITE_OTHER): Payer: Medicare Other | Admitting: Neurology

## 2013-08-31 ENCOUNTER — Ambulatory Visit (INDEPENDENT_AMBULATORY_CARE_PROVIDER_SITE_OTHER): Payer: Medicare Other | Admitting: *Deleted

## 2013-08-31 VITALS — BP 161/70 | HR 70

## 2013-08-31 DIAGNOSIS — G43019 Migraine without aura, intractable, without status migrainosus: Secondary | ICD-10-CM | POA: Diagnosis not present

## 2013-08-31 HISTORY — DX: Migraine without aura, intractable, without status migrainosus: G43.019

## 2013-08-31 MED ORDER — METOCLOPRAMIDE HCL 5 MG PO TABS: 5.0000 mg | ORAL_TABLET | Freq: Three times a day (TID) | ORAL | Status: DC

## 2013-08-31 MED ORDER — TOPIRAMATE 25 MG PO TABS: ORAL_TABLET | ORAL | Status: DC

## 2013-08-31 MED ORDER — FROVATRIPTAN SUCCINATE 2.5 MG PO TABS: ORAL_TABLET | ORAL | Status: DC

## 2013-08-31 NOTE — Progress Notes (Signed)
Reason for visit: Headache  Kimberly Miranda is a 73 y.o. female  History of present illness:  Kimberly Miranda is a 73 year old right-handed white female with a history of intermittent headaches in the past. The patient indicates that she will get 2 headaches a year, sometimes associated with nausea. The patient had some headaches for 15-20 years. Over the last 5 weeks, the patient indicates that her headaches have converted to a daily headache. The headache came on gradually, not suddenly, and headache includes the entirety of the head. She indicates that she does have photophobia and some mild photophobia, but she also has some nausea and vomiting. The patient indicates that when she stands up, the nausea worsens, and she has to lay down. She denies neck stiffness, confusion, visual loss with the headache. There is no focal numbness or weakness of the extremities. She has undergone a course of prednisone without improvement. She recently underwent MRI evaluation of the brain that was unremarkable. She is sent to this office for further evaluation. She is on Percocet for the pain without complete control. She is taking Zofran for nausea.  Past Medical History  Diagnosis Date  . High cholesterol   . Hypertension   . Vitiligo   . Nephrolithiasis   . Other specified cardiac dysrhythmias(427.89)   . Edema   . GERD (gastroesophageal reflux disease)   . Diabetes   . Migraine without aura, with intractable migraine, so stated, without mention of status migrainosus 08/31/2013    Past Surgical History  Procedure Laterality Date  . Tonsillectomy    . Back surgery    . Nasal sinus surgery    . Abdominal hysterectomy    . Colonoscopy      Family History  Problem Relation Age of Onset  . Hypertension Mother   . Hypertension Father   . Cancer - Colon Sister     Social history:  reports that she has never smoked. She has never used smokeless tobacco. She reports that she does not drink alcohol or  use illicit drugs.  Medications:  Current Outpatient Prescriptions on File Prior to Visit  Medication Sig Dispense Refill  . aspirin EC 81 MG tablet Take 81 mg by mouth daily.      . bimatoprost (LUMIGAN) 0.03 % ophthalmic solution Place 1 drop into both eyes at bedtime.      . Biotin 5 MG CAPS Take 5 mg by mouth daily.      . clobetasol cream (TEMOVATE) 0.05 % Use lightly as directed for vulvar irritation  30 g  0  . CRANBERRY FRUIT PO Take 1 capsule by mouth daily.      . furosemide (LASIX) 40 MG tablet Take 40 mg by mouth once a week.       . hydrochlorothiazide (HYDRODIURIL) 25 MG tablet Take 25 mg by mouth daily.      . hydrocortisone (ANUSOL-HC) 2.5 % rectal cream Place 1 application rectally 2 (two) times daily as needed for hemorrhoids or itching.      . ondansetron (ZOFRAN ODT) 4 MG disintegrating tablet 4mg  ODT q4 hours prn nausea/vomit  10 tablet  0  . oxyCODONE-acetaminophen (PERCOCET) 5-325 MG per tablet Take 1 tablet by mouth every 4 (four) hours as needed for severe pain. Dispense to go.  6 tablet  0  . ramipril (ALTACE) 10 MG capsule Take 10 mg by mouth daily.      . simvastatin (ZOCOR) 40 MG tablet Take 40 mg by mouth daily.  No current facility-administered medications on file prior to visit.      Allergies  Allergen Reactions  . Codeine Other (See Comments)    Stomach upset    ROS:  Out of a complete 14 system review of symptoms, the patient complains only of the following symptoms, and all other reviewed systems are negative.  Fatigue Easy bruising Feeling hot, cold Headache, dizziness Decreased energy Insomnia  Blood pressure 161/70, pulse 70.  Physical Exam  General: The patient is alert and cooperative at the time of the examination. The patient is moderately obese.  Eyes: Pupils are equal, round, and reactive to light. Discs are flat bilaterally.  Neck: The neck is supple, no carotid bruits are noted.  Respiratory: The respiratory  examination is clear.  Cardiovascular: The cardiovascular examination reveals a regular rate and rhythm, no obvious murmurs or rubs are noted.  Skin: Extremities are without significant edema.  Neurologic Exam  Mental status: The patient is alert and oriented x 3 at the time of the examination. The patient has apparent normal recent and remote memory, with an apparently normal attention span and concentration ability.  Cranial nerves: Facial symmetry is present. There is good sensation of the face to pinprick and soft touch bilaterally. The strength of the facial muscles and the muscles to head turning and shoulder shrug are normal bilaterally. Speech is well enunciated, no aphasia or dysarthria is noted. Extraocular movements are full. Visual fields are full. The tongue is midline, and the patient has symmetric elevation of the soft palate. No obvious hearing deficits are noted.  Motor: The motor testing reveals 5 over 5 strength of all 4 extremities. Good symmetric motor tone is noted throughout.  Sensory: Sensory testing is intact to pinprick, soft touch, vibration sensation, and position sense on all 4 extremities. No evidence of extinction is noted.  Coordination: Cerebellar testing reveals good finger-nose-finger and heel-to-shin bilaterally.  Gait and station: Gait is normal. Tandem gait is normal. Romberg is negative. No drift is seen.  Reflexes: Deep tendon reflexes are symmetric and normal bilaterally. Toes are downgoing bilaterally.   MRI brain 08/28/2013:  IMPRESSION:  No acute intracranial process. Normal noncontrast MRI of the brain  for age.    Assessment/Plan:  1. Probable converted migraine  The patient has a history of intermittent headaches over the last 15-20 years, but the headaches have become daily in nature. She did not respond to prednisone, making other entities such as temporal arteritis less likely. The patient will undergo blood work today to include a  sedimentation rate, and she will be given a trial on Depacon IV and IV Phenergan today. She will be placed on Topamax taking 25 mg twice daily for one week, then go to 50 mg twice daily. She will be placed on Reglan taking 5 mg 3 times daily. If the headaches do not abate within the next week, a hospitalization for a DHE 45 protocol may be indicated. Otherwise, she will followup in 3 months.  Jill Alexanders MD 08/31/2013 9:14 PM  [facility] [address] [place], [place] [address]  Phone [phone] Fax [phone]

## 2013-08-31 NOTE — Patient Instructions (Signed)
Migraine Headache A migraine headache is an intense, throbbing pain on one or both sides of your head. A migraine can last for 30 minutes to several hours. CAUSES  The exact cause of a migraine headache is not always known. However, a migraine may be caused when nerves in the brain become irritated and release chemicals that cause inflammation. This causes pain. Certain things may also trigger migraines, such as:  Alcohol.  Smoking.  Stress.  Menstruation.  Aged cheeses.  Foods or drinks that contain nitrates, glutamate, aspartame, or tyramine.  Lack of sleep.  Chocolate.  Caffeine.  Hunger.  Physical exertion.  Fatigue.  Medicines used to treat chest pain (nitroglycerine), birth control pills, estrogen, and some blood pressure medicines. SIGNS AND SYMPTOMS  Pain on one or both sides of your head.  Pulsating or throbbing pain.  Severe pain that prevents daily activities.  Pain that is aggravated by any physical activity.  Nausea, vomiting, or both.  Dizziness.  Pain with exposure to bright lights, loud noises, or activity.  General sensitivity to bright lights, loud noises, or smells. Before you get a migraine, you may get warning signs that a migraine is coming (aura). An aura may include:  Seeing flashing lights.  Seeing bright spots, halos, or zig-zag lines.  Having tunnel vision or blurred vision.  Having feelings of numbness or tingling.  Having trouble talking.  Having muscle weakness. DIAGNOSIS  A migraine headache is often diagnosed based on:  Symptoms.  Physical exam.  A CT scan or MRI of your head. These imaging tests cannot diagnose migraines, but they can help rule out other causes of headaches. TREATMENT Medicines may be given for pain and nausea. Medicines can also be given to help prevent recurrent migraines.  HOME CARE INSTRUCTIONS  Only take over-the-counter or prescription medicines for pain or discomfort as directed by your  health care provider. The use of long-term narcotics is not recommended.  Lie down in a dark, quiet room when you have a migraine.  Keep a journal to find out what may trigger your migraine headaches. For example, write down:  What you eat and drink.  How much sleep you get.  Any change to your diet or medicines.  Limit alcohol consumption.  Quit smoking if you smoke.  Get 7 9 hours of sleep, or as recommended by your health care provider.  Limit stress.  Keep lights dim if bright lights bother you and make your migraines worse. SEEK IMMEDIATE MEDICAL CARE IF:   Your migraine becomes severe.  You have a fever.  You have a stiff neck.  You have vision loss.  You have muscular weakness or loss of muscle control.  You start losing your balance or have trouble walking.  You feel faint or pass out.  You have severe symptoms that are different from your first symptoms. MAKE SURE YOU:   Understand these instructions.  Will watch your condition.  Will get help right away if you are not doing well or get worse. Document Released: 04/20/2005 Document Revised: 02/08/2013 Document Reviewed: 12/26/2012 [employer] Patient Information 2014 [employer].  

## 2013-09-01 ENCOUNTER — Other Ambulatory Visit: Payer: Self-pay | Admitting: Neurology

## 2013-09-01 ENCOUNTER — Telehealth: Payer: Self-pay | Admitting: *Deleted

## 2013-09-01 DIAGNOSIS — G43019 Migraine without aura, intractable, without status migrainosus: Secondary | ICD-10-CM | POA: Diagnosis not present

## 2013-09-01 LAB — SEDIMENTATION RATE: Sed Rate: 1 mm/hr (ref 0–22)

## 2013-09-01 MED ORDER — VALPROATE SODIUM 500 MG/5ML IV SOLN
1500.0000 mg | INTRAVENOUS | Status: DC
  Administered 2013-08-31: 1500 mg via INTRAVENOUS

## 2013-09-01 MED ORDER — PROMETHAZINE HCL 25 MG/ML IJ SOLN
25.0000 mg | Freq: Once | INTRAMUSCULAR | Status: AC
  Administered 2013-08-31: 25 mg via INTRAVENOUS

## 2013-09-01 NOTE — Progress Notes (Signed)
Pt here for appt with Dr. Jannifer Franklin.  Order for Depacon 1000mg  IV and may additional 500mg  IV if needed, also phenergan 25mg  IVP.  I took pt and her husband to treatment room.  She in wheelchair.  She was made comfortable in recliner.  Instructed on plan of care.  Under aseptic technique 24g attempted to L AC and R AC (outer) both unsuccessful.  Dyke Brackett, RN attempted to [facility] (mid) with good blood return with 24 g angiocath at 1245.  Taped securely and Depacon 1000mg  / 0.9% NS 122ml started at 1246.  Headache level 10+.  C/o all over head pain.  Lights dimmed, reclining position and blankets applied.  Husband at side. No change in level.   At 1255 another 500mg  depacon IV/ NS 14ml given. No change in level.  At 1307 phenergan 25mg  IVP given over 3 minutes for nausea.  Pt stated some relief with this.  No change in headache level.  At 1320 IV discontinued.  Pressure held, and bandage applied.  Pt HR 68.  Pt groggy, husband at side.  Pt wheeled out to car.  Husband to drive. Dr. Jannifer Franklin informed of results.   Pt instructed about new medications and use of percocet with them.  To use prn.

## 2013-09-01 NOTE — Telephone Encounter (Signed)
I called and LMVM for pt and husband that she can have the [facility] at [facility] across from Dr. Ria Comment office to do the ESR.  Pt has the lab requistion slip, and [place] with [facility] was asked and then given the information that they may come in for lab test. [phone], [phone]

## 2013-09-01 NOTE — Patient Instructions (Signed)
Pt and husband to call us if needed.

## 2013-09-04 NOTE — Progress Notes (Signed)
Quick Note:  Called to share lab results with patient , she verbalized understanding ______

## 2013-09-11 ENCOUNTER — Telehealth: Payer: Self-pay | Admitting: *Deleted

## 2013-09-11 NOTE — Telephone Encounter (Signed)
Patient's daughter Lattie Haw calling, stating patient is experiencing diarrhea, vomiting, and headache has returned.  Questioning if this is a side effect of topiramate (TOPAMAX) 25 MG tablet.  Please return her call.

## 2013-09-11 NOTE — Telephone Encounter (Signed)
I called the patient, talked with the daughter. The patient is doing quite well this taking 1 tablet daily, with good improvement in headache. When she went to 50 mg dosing, the headaches return with diarrhea and vomiting. This could be related to the headache itself, but she was doing well just on one tablet, I will go back to this. In regards of glaucoma, only closed-angle glaucoma is affected by Topamax,

## 2013-09-11 NOTE — Telephone Encounter (Signed)
Dr. Jannifer Franklin please see note below

## 2013-09-11 NOTE — Addendum Note (Signed)
Addended by: Margette Fast on: 09/11/2013 11:26 AM   Modules accepted: Orders

## 2013-09-11 NOTE — Telephone Encounter (Signed)
Patient started Topamax 25 mg on 08-31-13 she just increased this med from qd to bid and that's when she started experiencing Diarrhea, Vomiting and headace due to the vomiting.. Patient daughter wants to remind DR. Willis that her mother has glaucoma and she read on the web today the cautions of glaucoma and topamax .please advise best contact for daughter is [phone]. Lattie Haw)

## 2013-10-16 DIAGNOSIS — K2100 Gastro-esophageal reflux disease with esophagitis, without bleeding: Secondary | ICD-10-CM | POA: Diagnosis not present

## 2013-10-16 DIAGNOSIS — E119 Type 2 diabetes mellitus without complications: Secondary | ICD-10-CM | POA: Diagnosis not present

## 2013-10-16 DIAGNOSIS — E785 Hyperlipidemia, unspecified: Secondary | ICD-10-CM | POA: Diagnosis not present

## 2013-10-16 DIAGNOSIS — Z79899 Other long term (current) drug therapy: Secondary | ICD-10-CM | POA: Diagnosis not present

## 2013-10-16 DIAGNOSIS — I10 Essential (primary) hypertension: Secondary | ICD-10-CM | POA: Diagnosis not present

## 2013-10-20 DIAGNOSIS — I498 Other specified cardiac arrhythmias: Secondary | ICD-10-CM | POA: Diagnosis not present

## 2013-10-20 DIAGNOSIS — Z0000 Encounter for general adult medical examination without abnormal findings: Secondary | ICD-10-CM | POA: Diagnosis not present

## 2013-10-20 DIAGNOSIS — Z23 Encounter for immunization: Secondary | ICD-10-CM | POA: Diagnosis not present

## 2013-11-06 DIAGNOSIS — M719 Bursopathy, unspecified: Secondary | ICD-10-CM | POA: Diagnosis not present

## 2013-11-06 DIAGNOSIS — M67919 Unspecified disorder of synovium and tendon, unspecified shoulder: Secondary | ICD-10-CM | POA: Diagnosis not present

## 2013-11-06 DIAGNOSIS — M542 Cervicalgia: Secondary | ICD-10-CM | POA: Diagnosis not present

## 2013-11-15 DIAGNOSIS — S139XXA Sprain of joints and ligaments of unspecified parts of neck, initial encounter: Secondary | ICD-10-CM | POA: Diagnosis not present

## 2013-11-30 ENCOUNTER — Ambulatory Visit: Payer: Medicare Other | Admitting: Adult Health

## 2013-12-04 ENCOUNTER — Encounter: Payer: Self-pay | Admitting: Adult Health

## 2013-12-04 ENCOUNTER — Ambulatory Visit (INDEPENDENT_AMBULATORY_CARE_PROVIDER_SITE_OTHER): Payer: Medicare Other | Admitting: Adult Health

## 2013-12-04 ENCOUNTER — Ambulatory Visit: Payer: Medicare Other | Admitting: Adult Health

## 2013-12-04 VITALS — BP 145/68 | HR 58 | Ht 62.0 in | Wt 204.4 lb

## 2013-12-04 DIAGNOSIS — G43019 Migraine without aura, intractable, without status migrainosus: Secondary | ICD-10-CM | POA: Diagnosis not present

## 2013-12-04 NOTE — Progress Notes (Signed)
I have read the note, and I agree with the clinical assessment and plan.  WILLIS,CHARLES KEITH   

## 2013-12-04 NOTE — Progress Notes (Signed)
PATIENT: Kimberly Miranda DOB: 08-27-1940  REASON FOR VISIT: follow up HISTORY FROM: patient  HISTORY OF PRESENT ILLNESS: Ms. Kimberly Miranda is a 73 year old female with a history of headaches. She returns today for follow-up. She is currently taking Topamax 25mg  daily. Patient states that her headaches have improved. She reports that she has had no headaches since starting Topamax. She states that she takes usually only 1 pill a day but if she remembers she will take two. She states that when she increased to 4 tablets a daily so had nausea and vomiting. No new medical issues since last seen. She does have some shoulder pain but has been prescribed some cream that is working well for her.   HISTORY 08/31/13:   history of intermittent headaches in the past. The patient indicates that she will get 2 headaches a year, sometimes associated with nausea. The patient had some headaches for 15-20 years. Over the last 5 weeks, the patient indicates that her headaches have converted to a daily headache. The headache came on gradually, not suddenly, and headache includes the entirety of the head. She indicates that she does have photophobia and some mild photophobia, but she also has some nausea and vomiting. The patient indicates that when she stands up, the nausea worsens, and she has to lay down. She denies neck stiffness, confusion, visual loss with the headache. There is no focal numbness or weakness of the extremities. She has undergone a course of prednisone without improvement. She recently underwent MRI evaluation of the brain that was unremarkable. She is sent to this office for further evaluation. She is on Percocet for the pain without complete control. She is taking Zofran for nausea.   REVIEW OF SYSTEMS: Full 14 system review of systems performed and notable only for:  Constitutional: N/A  Eyes: N/A Ear/Nose/Throat: N/A  Skin: N/A  Cardiovascular: N/A  Respiratory: N/A  Gastrointestinal: N/A    Genitourinary: N/A Hematology/Lymphatic: N/A  Endocrine: N/A Musculoskeletal:N/A  Allergy/Immunology: N/A  Neurological: N/A Psychiatric: N/A Sleep: N/A   ALLERGIES: Allergies  Allergen Reactions  . Codeine Other (See Comments)    Stomach upset    HOME MEDICATIONS: Outpatient Prescriptions Prior to Visit  Medication Sig Dispense Refill  . aspirin EC 81 MG tablet Take 81 mg by mouth daily.      . bimatoprost (LUMIGAN) 0.03 % ophthalmic solution Place 1 drop into both eyes at bedtime.      . clobetasol cream (TEMOVATE) 0.05 % Use lightly as directed for vulvar irritation  30 g  0  . frovatriptan (FROVA) 2.5 MG tablet One tablet twice a day for 3 days  6 tablet  0  . furosemide (LASIX) 40 MG tablet Take 40 mg by mouth once a week.       . hydrochlorothiazide (HYDRODIURIL) 25 MG tablet Take 25 mg by mouth daily.      . hydrocortisone (ANUSOL-HC) 2.5 % rectal cream Place 1 application rectally 2 (two) times daily as needed for hemorrhoids or itching.      . ramipril (ALTACE) 10 MG capsule Take 10 mg by mouth daily.      . simvastatin (ZOCOR) 40 MG tablet Take 40 mg by mouth daily.      Marland Kitchen topiramate (TOPAMAX) 25 MG tablet Take 25 mg by mouth at bedtime.      . Biotin 5 MG CAPS Take 5 mg by mouth daily.      Marland Kitchen CRANBERRY FRUIT PO Take 1 capsule by  mouth daily.      . mefloquine (LARIAM) 250 MG tablet Take 250 mg by mouth daily.      . metoCLOPramide (REGLAN) 5 MG tablet Take 1 tablet (5 mg total) by mouth 3 (three) times daily before meals.  30 tablet  1  . ondansetron (ZOFRAN ODT) 4 MG disintegrating tablet 4mg  ODT q4 hours prn nausea/vomit  10 tablet  0  . oxyCODONE-acetaminophen (PERCOCET) 5-325 MG per tablet Take 1 tablet by mouth every 4 (four) hours as needed for severe pain. Dispense to go.  6 tablet  0   No facility-administered medications prior to visit.    PAST MEDICAL HISTORY: Past Medical History  Diagnosis Date  . High cholesterol   . Hypertension   . Vitiligo    . Nephrolithiasis   . Other specified cardiac dysrhythmias(427.89)   . Edema   . GERD (gastroesophageal reflux disease)   . Diabetes   . Migraine without aura, with intractable migraine, so stated, without mention of status migrainosus 08/31/2013    PAST SURGICAL HISTORY: Past Surgical History  Procedure Laterality Date  . Tonsillectomy    . Back surgery    . Nasal sinus surgery    . Abdominal hysterectomy    . Colonoscopy      FAMILY HISTORY: Family History  Problem Relation Age of Onset  . Hypertension Mother   . Hypertension Father   . Cancer - Colon Sister     SOCIAL HISTORY: History   Social History  . Marital Status: Married    Spouse Name: N/A    Number of Children: 3  . Years of Education: hs   Occupational History  . Retired    Social History Main Topics  . Smoking status: Never Smoker   . Smokeless tobacco: Never Used  . Alcohol Use: No  . Drug Use: No  . Sexual Activity: Not on file   Other Topics Concern  . Not on file   Social History Narrative   Patient is married with 3 children   Patient is right handed   Patient has a high school education   Patient does not drink any caffeine, occ chocolate candy      PHYSICAL EXAM  Filed Vitals:   12/04/13 1011  BP: 145/68  Pulse: 58  Height: 5\' 2"  (1.575 m)  Weight: 204 lb 6.4 oz (92.715 kg)   Body mass index is 37.38 kg/(m^2).  Generalized: Well developed, in no acute distress   Neurological examination  Mentation: Alert oriented to time, place, history taking. Follows all commands speech and language fluent Cranial nerve II-XII:  Extraocular movements were full, visual field were full on confrontational test.  Motor: The motor testing reveals 5 over 5 strength of all 4 extremities. Good symmetric motor tone is noted throughout.  Sensory: Sensory testing is intact to soft touch on all 4 extremities. No evidence of extinction is noted.  Coordination: Cerebellar testing reveals good  finger-nose-finger and heel-to-shin bilaterally.  Gait and station: Gait is normal. Tandem gait is normal. Romberg is negative. No drift is seen.  Reflexes: Deep tendon reflexes are symmetric and normal bilaterally.    DIAGNOSTIC DATA (LABS, IMAGING, TESTING) - I reviewed patient records, labs, notes, testing and imaging myself where available.  Lab Results  Component Value Date   WBC 9.3 08/24/2013   HGB 17.4* 08/24/2013   HCT 50.4* 08/24/2013   MCV 87.7 08/24/2013   PLT 306 08/24/2013      Component Value Date/Time  NA 140 08/24/2013 1906   K 4.0 08/24/2013 1906   CL 98 08/24/2013 1906   CO2 32 08/24/2013 1906   GLUCOSE 183* 08/24/2013 1906   BUN 22 08/24/2013 1906   CREATININE 0.85 08/24/2013 1906   CALCIUM 9.9 08/24/2013 1906   PROT 7.3 08/24/2013 1906   ALBUMIN 4.0 08/24/2013 1906   AST 21 08/24/2013 1906   ALT 19 08/24/2013 1906   ALKPHOS 82 08/24/2013 1906   BILITOT 0.6 08/24/2013 1906   GFRNONAA 65* 08/24/2013 1906   GFRAA 76* 08/24/2013 1906       ASSESSMENT AND PLAN 73 y.o. year old female  has a past medical history of High cholesterol; Hypertension; Vitiligo; Nephrolithiasis; Other specified cardiac dysrhythmias(427.89); Edema; GERD (gastroesophageal reflux disease); Diabetes; and Migraine without aura, with intractable migraine, so stated, without mention of status migrainosus (08/31/2013). here with:  1. Migraine  Patient reports that she has not had a headache since starting topamax. Patient should continue Topamax. She can continue taking 1 tablet daily. If headaches return she can increase to 2 tablets daily. No refills needed at this time. Patient should follow up in 6 months or sooner if needed.  Ward Givens, MSN, NP-C 12/04/2013, 10:18 AM [facility] [address], [place], [place] [address] [phone]  Note: This document was prepared with digital dictation and possible smart phrase technology. Any transcriptional errors that result  from this process are unintentional.

## 2013-12-04 NOTE — Patient Instructions (Signed)
Topiramate tablets What is this medicine? TOPIRAMATE (toe PYRE a mate) is used to treat seizures in adults or children with epilepsy. It is also used for the prevention of migraine headaches. This medicine may be used for other purposes; ask your health care provider or pharmacist if you have questions. COMMON BRAND NAME(S): Topamax, Topiragen What should I tell my health care provider before I take this medicine? They need to know if you have any of these conditions: -bleeding disorders -cirrhosis of the liver or liver disease -diarrhea -glaucoma -kidney stones or kidney disease -low blood counts, like low white cell, platelet, or red cell counts -lung disease like asthma, obstructive pulmonary disease, emphysema -metabolic acidosis -on a ketogenic diet -schedule for surgery or a procedure -suicidal thoughts, plans, or attempt; a previous suicide attempt by you or a family member -an unusual or allergic reaction to topiramate, other medicines, foods, dyes, or preservatives -pregnant or trying to get pregnant -breast-feeding How should I use this medicine? Take this medicine by mouth with a glass of water. Follow the directions on the prescription label. Do not crush or chew. You may take this medicine with meals. Take your medicine at regular intervals. Do not take it more often than directed. Talk to your pediatrician regarding the use of this medicine in children. Special care may be needed. While this drug may be prescribed for children as young as 2 years of age for selected conditions, precautions do apply. Overdosage: If you think you have taken too much of this medicine contact a poison control center or emergency room at once. NOTE: This medicine is only for you. Do not share this medicine with others. What if I miss a dose? If you miss a dose, take it as soon as you can. If your next dose is to be taken in less than 6 hours, then do not take the missed dose. Take the next dose at  your regular time. Do not take double or extra doses. What may interact with this medicine? Do not take this medicine with any of the following medications: -probenecid This medicine may also interact with the following medications: -acetazolamide -alcohol -amitriptyline -aspirin and aspirin-like medicines -birth control pills -certain medicines for depression -certain medicines for seizures -certain medicines that treat or prevent blood clots like warfarin, enoxaparin, dalteparin, apixaban, dabigatran, and rivaroxaban -digoxin -hydrochlorothiazide -lithium -medicines for pain, sleep, or muscle relaxation -metformin -methazolamide -NSAIDS, medicines for pain and inflammation, like ibuprofen or naproxen -pioglitazone -risperidone This list may not describe all possible interactions. Give your health care provider a list of all the medicines, herbs, non-prescription drugs, or dietary supplements you use. Also tell them if you smoke, drink alcohol, or use illegal drugs. Some items may interact with your medicine. What should I watch for while using this medicine? Visit your doctor or health care professional for regular checks on your progress. Do not stop taking this medicine suddenly. This increases the risk of seizures if you are using this medicine to control epilepsy. Wear a medical identification bracelet or chain to say you have epilepsy or seizures, and carry a card that lists all your medicines. This medicine can decrease sweating and increase your body temperature. Watch for signs of deceased sweating or fever, especially in children. Avoid extreme heat, hot baths, and saunas. Be careful about exercising, especially in hot weather. Contact your health care provider right away if you notice a fever or decrease in sweating. You should drink plenty of fluids while taking this medicine.   If you have had kidney stones in the past, this will help to reduce your chances of forming kidney  stones. If you have stomach pain, with nausea or vomiting and yellowing of your eyes or skin, call your doctor immediately. You may get drowsy, dizzy, or have blurred vision. Do not drive, use machinery, or do anything that needs mental alertness until you know how this medicine affects you. To reduce dizziness, do not sit or stand up quickly, especially if you are an older patient. Alcohol can increase drowsiness and dizziness. Avoid alcoholic drinks. If you notice blurred vision, eye pain, or other eye problems, seek medical attention at once for an eye exam. The use of this medicine may increase the chance of suicidal thoughts or actions. Pay special attention to how you are responding while on this medicine. Any worsening of mood, or thoughts of suicide or dying should be reported to your health care professional right away. This medicine may increase the chance of developing metabolic acidosis. If left untreated, this can cause kidney stones, bone disease, or slowed growth in children. Symptoms include breathing fast, fatigue, loss of appetite, irregular heartbeat, or loss of consciousness. Call your doctor immediately if you experience any of these side effects. Also, tell your doctor about any surgery you plan on having while taking this medicine since this may increase your risk for metabolic acidosis. Birth control pills may not work properly while you are taking this medicine. Talk to your doctor about using an extra method of birth control. Women who become pregnant while using this medicine may enroll in the [place] Antiepileptic Drug Pregnancy Registry by calling 1-888-233-2334. This registry collects information about the safety of antiepileptic drug use during pregnancy. What side effects may I notice from receiving this medicine? Side effects that you should report to your doctor or health care professional as soon as possible: -allergic reactions like skin rash, itching or hives,  swelling of the face, lips, or tongue -decreased sweating and/or rise in body temperature -depression -difficulty breathing, fast or irregular breathing patterns -difficulty speaking -difficulty walking or controlling muscle movements -hearing impairment -redness, blistering, peeling or loosening of the skin, including inside the mouth -tingling, pain or numbness in the hands or feet -unusual bleeding or bruising -unusually weak or tired -worsening of mood, thoughts or actions of suicide or dying Side effects that usually do not require medical attention (report to your doctor or health care professional if they continue or are bothersome): -altered taste -back pain, joint or muscle aches and pains -diarrhea, or constipation -headache -loss of appetite -nausea -stomach upset, indigestion -tremors This list may not describe all possible side effects. Call your doctor for medical advice about side effects. You may report side effects to FDA at 1-800-FDA-1088. Where should I keep my medicine? Keep out of the reach of children. Store at room temperature between 15 and 30 degrees C (59 and 86 degrees F) in a tightly closed container. Protect from moisture. Throw away any unused medicine after the expiration date. NOTE: This sheet is a summary. It may not cover all possible information. If you have questions about this medicine, talk to your doctor, pharmacist, or health care provider.  2015, [employer]. (2013-04-24 23:17:57)  

## 2013-12-05 DIAGNOSIS — H5340 Unspecified visual field defects: Secondary | ICD-10-CM | POA: Diagnosis not present

## 2013-12-05 DIAGNOSIS — H2510 Age-related nuclear cataract, unspecified eye: Secondary | ICD-10-CM | POA: Diagnosis not present

## 2013-12-05 DIAGNOSIS — E119 Type 2 diabetes mellitus without complications: Secondary | ICD-10-CM | POA: Diagnosis not present

## 2013-12-05 DIAGNOSIS — H4011X0 Primary open-angle glaucoma, stage unspecified: Secondary | ICD-10-CM | POA: Diagnosis not present

## 2013-12-06 ENCOUNTER — Other Ambulatory Visit (HOSPITAL_COMMUNITY): Payer: Self-pay | Admitting: Internal Medicine

## 2013-12-06 DIAGNOSIS — Z1231 Encounter for screening mammogram for malignant neoplasm of breast: Secondary | ICD-10-CM

## 2013-12-06 DIAGNOSIS — M542 Cervicalgia: Secondary | ICD-10-CM | POA: Diagnosis not present

## 2013-12-07 ENCOUNTER — Ambulatory Visit (HOSPITAL_COMMUNITY)
Admission: RE | Admit: 2013-12-07 | Discharge: 2013-12-07 | Disposition: A | Discharge status: Home / Self Care | Payer: Medicare Other | Source: Ambulatory Visit | Attending: Internal Medicine | Admitting: Internal Medicine

## 2013-12-07 DIAGNOSIS — Z1231 Encounter for screening mammogram for malignant neoplasm of breast: Secondary | ICD-10-CM | POA: Diagnosis not present

## 2014-01-03 DIAGNOSIS — H60509 Unspecified acute noninfective otitis externa, unspecified ear: Secondary | ICD-10-CM | POA: Diagnosis not present

## 2014-01-03 DIAGNOSIS — H9209 Otalgia, unspecified ear: Secondary | ICD-10-CM | POA: Diagnosis not present

## 2014-02-21 DIAGNOSIS — Z23 Encounter for immunization: Secondary | ICD-10-CM | POA: Diagnosis not present

## 2014-03-19 DIAGNOSIS — H4011X3 Primary open-angle glaucoma, severe stage: Secondary | ICD-10-CM | POA: Diagnosis not present

## 2014-03-21 ENCOUNTER — Encounter: Payer: Self-pay | Admitting: Neurology

## 2014-03-27 ENCOUNTER — Encounter: Payer: Self-pay | Admitting: Neurology

## 2014-04-03 DIAGNOSIS — J019 Acute sinusitis, unspecified: Secondary | ICD-10-CM | POA: Diagnosis not present

## 2014-04-10 ENCOUNTER — Ambulatory Visit (INDEPENDENT_AMBULATORY_CARE_PROVIDER_SITE_OTHER): Payer: Medicare Other | Admitting: Obstetrics and Gynecology

## 2014-04-10 ENCOUNTER — Encounter: Payer: Self-pay | Admitting: Obstetrics and Gynecology

## 2014-04-10 VITALS — BP 136/68 | Ht 62.0 in | Wt 216.0 lb

## 2014-04-10 DIAGNOSIS — N904 Leukoplakia of vulva: Secondary | ICD-10-CM | POA: Diagnosis not present

## 2014-04-10 NOTE — Progress Notes (Signed)
Patient ID: Kimberly Miranda, female   DOB: Jul 05, 1940, 73 y.o.   MRN: [id number] Pt here today for refill on medication and a check up. Pt denies any problems or concerns at this time.    [facility] Visit  Patient name: Kimberly Miranda MRN [id number]  Date of birth: May 23, 1940  CC & HPI:  Kimberly Miranda is a 73 y.o. female presenting today for renewal of Temovate  ROS:  Mild sui. No PMb/ uses temovate 1-2 x /month  Pertinent History Reviewed:   Reviewed: Significant for vag hyst ant repair Medical         Past Medical History  Diagnosis Date  . High cholesterol   . Hypertension   . Vitiligo   . Nephrolithiasis   . Other specified cardiac dysrhythmias(427.89)   . Edema   . GERD (gastroesophageal reflux disease)   . Diabetes   . Migraine without aura, with intractable migraine, so stated, without mention of status migrainosus 08/31/2013                              Surgical Hx:    Past Surgical History  Procedure Laterality Date  . Tonsillectomy    . Back surgery    . Nasal sinus surgery    . Abdominal hysterectomy    . Colonoscopy     Medications: Reviewed & Updated - see associated section                      Current outpatient prescriptions: aspirin EC 81 MG tablet, Take 81 mg by mouth daily., Disp: , Rfl: ;  bimatoprost (LUMIGAN) 0.03 % ophthalmic solution, Place 1 drop into both eyes at bedtime., Disp: , Rfl: ;  clobetasol cream (TEMOVATE) 0.05 %, Use lightly as directed for vulvar irritation, Disp: 30 g, Rfl: 0;  furosemide (LASIX) 40 MG tablet, Take 40 mg by mouth once a week. , Disp: , Rfl:  hydrochlorothiazide (HYDRODIURIL) 25 MG tablet, Take 25 mg by mouth daily., Disp: , Rfl: ;  ramipril (ALTACE) 10 MG capsule, Take 10 mg by mouth daily., Disp: , Rfl: ;  simvastatin (ZOCOR) 40 MG tablet, Take 40 mg by mouth daily., Disp: , Rfl: ;  topiramate (TOPAMAX) 25 MG tablet, Take 25 mg by mouth at bedtime., Disp: , Rfl:  hydrocortisone (ANUSOL-HC) 2.5 % rectal cream, Place  1 application rectally 2 (two) times daily as needed for hemorrhoids or itching., Disp: , Rfl:    Social History: Reviewed -  reports that she has never smoked. She has never used smokeless tobacco.  Objective Findings:  Vitals: Blood pressure 136/68, height 5\' 2"  (1.575 m), weight 216 lb (97.977 kg).  Physical Examination: Abdomen - soft, nontender, nondistended, no masses or organomegaly Pelvic - VULVA: vulvar excoriation dystrophy, no distinct lesions, vulvar hypopigmentation c/w dystrophy , VAGINA: atrophic, CERVIX: normal appearing cervix without discharge or lesions, surgically absent, UTERUS: surgically absent, vaginal cuff well healed, ADNEXA: normal adnexa in size, nontender and no masses, tenderness none   Assessment & Plan:   A:  1. Vulvar dystrophy  P:  1. Renew Temovate prn

## 2014-04-19 ENCOUNTER — Other Ambulatory Visit: Payer: Self-pay | Admitting: Obstetrics and Gynecology

## 2014-04-19 DIAGNOSIS — M545 Low back pain: Secondary | ICD-10-CM | POA: Diagnosis not present

## 2014-04-19 NOTE — Telephone Encounter (Signed)
Pt states that Dr. Glo Herring told her that he would refill her cream but the pharmacy says they don't have a Rx for that medication for her. I advised the pt that I would put the order in and route the message to Dr. Glo Herring. I also advised the pt to check with her pharmacy in the morning.

## 2014-04-20 MED ORDER — CLOBETASOL PROPIONATE 0.05 % EX CREA: TOPICAL_CREAM | CUTANEOUS | Status: DC

## 2014-04-24 DIAGNOSIS — Z87440 Personal history of urinary (tract) infections: Secondary | ICD-10-CM | POA: Diagnosis not present

## 2014-04-24 DIAGNOSIS — E119 Type 2 diabetes mellitus without complications: Secondary | ICD-10-CM | POA: Diagnosis not present

## 2014-05-01 ENCOUNTER — Encounter: Payer: Self-pay | Admitting: Gastroenterology

## 2014-05-07 ENCOUNTER — Ambulatory Visit: Payer: Medicare Other | Admitting: Nurse Practitioner

## 2014-05-09 ENCOUNTER — Encounter: Payer: Self-pay | Admitting: Gastroenterology

## 2014-05-09 ENCOUNTER — Other Ambulatory Visit: Payer: Self-pay

## 2014-05-09 ENCOUNTER — Ambulatory Visit (INDEPENDENT_AMBULATORY_CARE_PROVIDER_SITE_OTHER): Payer: Medicare Other | Admitting: Gastroenterology

## 2014-05-09 VITALS — BP 146/76 | HR 56 | Temp 97.0°F | Ht 62.0 in | Wt 218.8 lb

## 2014-05-09 DIAGNOSIS — R1314 Dysphagia, pharyngoesophageal phase: Secondary | ICD-10-CM

## 2014-05-09 DIAGNOSIS — D126 Benign neoplasm of colon, unspecified: Secondary | ICD-10-CM | POA: Insufficient documentation

## 2014-05-09 DIAGNOSIS — Z800 Family history of malignant neoplasm of digestive organs: Secondary | ICD-10-CM | POA: Insufficient documentation

## 2014-05-09 MED ORDER — PEG-KCL-NACL-NASULF-NA ASC-C 100 G PO SOLR: 1.0000 | ORAL | Status: DC

## 2014-05-09 NOTE — Assessment & Plan Note (Signed)
Remote history of esophageal dilatation per patient by Dr. Arnoldo Morale, now with recurrent chronic solid food dysphagia. Query occult web, ring, stricture. May have element of esophageal dysmotility. Nocturnal reflux associated with eating behaviors; discussed dietary and behavior modifications. Currently not on a PPI; will implement dietary and behavior modifications, proceed with EGD/ED, and determine need for PPI after procedure.   Proceed with upper endoscopy and dilation in the near future with Dr. Gala Romney. The risks, benefits, and alternatives have been discussed in detail with patient. They have stated understanding and desire to proceed.

## 2014-05-09 NOTE — Patient Instructions (Signed)
Please review the reflux diet handout attached. We have scheduled you for a colonoscopy, upper endoscopy, and dilation with Dr. Gala Romney in the near future.    Gastroesophageal Reflux Disease, Adult Gastroesophageal reflux disease (GERD) happens when acid from your stomach flows up into the esophagus. When acid comes in contact with the esophagus, the acid causes soreness (inflammation) in the esophagus. Over time, GERD may create small holes (ulcers) in the lining of the esophagus. CAUSES   Increased body weight. This puts pressure on the stomach, making acid rise from the stomach into the esophagus.  Smoking. This increases acid production in the stomach.  Drinking alcohol. This causes decreased pressure in the lower esophageal sphincter (valve or ring of muscle between the esophagus and stomach), allowing acid from the stomach into the esophagus.  Late evening meals and a full stomach. This increases pressure and acid production in the stomach.  A malformed lower esophageal sphincter. Sometimes, no cause is found. SYMPTOMS   Burning pain in the lower part of the mid-chest behind the breastbone and in the mid-stomach area. This may occur twice a week or more often.  Trouble swallowing.  Sore throat.  Dry cough.  Asthma-like symptoms including chest tightness, shortness of breath, or wheezing. DIAGNOSIS  Your caregiver may be able to diagnose GERD based on your symptoms. In some cases, X-rays and other tests may be done to check for complications or to check the condition of your stomach and esophagus. TREATMENT  Your caregiver may recommend over-the-counter or prescription medicines to help decrease acid production. Ask your caregiver before starting or adding any new medicines.  HOME CARE INSTRUCTIONS   Change the factors that you can control. Ask your caregiver for guidance concerning weight loss, quitting smoking, and alcohol consumption.  Avoid foods and drinks that make your  symptoms worse, such as:  Caffeine or alcoholic drinks.  Chocolate.  Peppermint or mint flavorings.  Garlic and onions.  Spicy foods.  Citrus fruits, such as oranges, lemons, or limes.  Tomato-based foods such as sauce, chili, salsa, and pizza.  Fried and fatty foods.  Avoid lying down for the 3 hours prior to your bedtime or prior to taking a nap.  Eat small, frequent meals instead of large meals.  Wear loose-fitting clothing. Do not wear anything tight around your waist that causes pressure on your stomach.  Raise the head of your bed 6 to 8 inches with wood blocks to help you sleep. Extra pillows will not help.  Only take over-the-counter or prescription medicines for pain, discomfort, or fever as directed by your caregiver.  Do not take aspirin, ibuprofen, or other nonsteroidal anti-inflammatory drugs (NSAIDs). SEEK IMMEDIATE MEDICAL CARE IF:   You have pain in your arms, neck, jaw, teeth, or back.  Your pain increases or changes in intensity or duration.  You develop nausea, vomiting, or sweating (diaphoresis).  You develop shortness of breath, or you faint.  Your vomit is green, yellow, black, or looks like coffee grounds or blood.  Your stool is red, bloody, or black. These symptoms could be signs of other problems, such as heart disease, gastric bleeding, or esophageal bleeding. MAKE SURE YOU:   Understand these instructions.  Will watch your condition.  Will get help right away if you are not doing well or get worse. Document Released: 01/28/2005 Document Revised: 07/13/2011 Document Reviewed: 11/07/2010 [facility] Patient Information 2015 [place], [place]. This information is not intended to replace advice given to you by your health care provider. Make  sure you discuss any questions you have with your health care provider.  

## 2014-05-09 NOTE — Assessment & Plan Note (Signed)
74 year old female with history of tubular adenoma in 2011 on colonoscopy by Dr. Arnoldo Morale, with need for high risk surveillance due to family history of colon cancer in [place] her mother and father. No concerning lower GI symptoms.   Proceed with TCS with Dr. Gala Romney in near future: the risks, benefits, and alternatives have been discussed with the patient in detail. The patient states understanding and desires to proceed.

## 2014-05-09 NOTE — Progress Notes (Signed)
cc'ed to pcp °

## 2014-05-09 NOTE — Progress Notes (Signed)
Primary Care Physician:  Asencion Noble, MD Primary Gastroenterologist:  Dr. Gala Romney   Chief Complaint  Patient presents with  . Dysphagia  . set up colonoscopy    HPI:   Kimberly Miranda is a 74 y.o. female presenting today at the request of Dr. Willey Blade to set up surveillance colonoscopy and EGD. History of colonoscopy in 2011 by Dr. Arnoldo Morale with tubular adenoma. She has a family history of colon cancer in [place] her father and mother. Due for high risk surveillance now.   Recurrent choking, 2-3 times per month for the past year or two. Has remote history of esophageal dilatation in past by Dr. Arnoldo Morale. Usually noted while eating out, normally when first started eating. No liquid dysphagia. States will sit still and can feel the food slowly work itself down. Will have to regurgitate to get relief if out in public. No odynophagia. Small amount of reflux symptoms in the last few weeks. Takes an OTC agent similar to Tums. Only nocturnal episodes, "not bad". No lack of appetite or weight loss. States she actually gained 10 lbs over Christmas. Eating late at night.   No concerning lower GI symptoms. Will occasionally take Miralax for mild constipation. No rectal bleeding.   Past Medical History  Diagnosis Date  . High cholesterol   . Hypertension   . Vitiligo   . Nephrolithiasis   . Other specified cardiac dysrhythmias(427.89)   . Edema   . GERD (gastroesophageal reflux disease)   . Diabetes   . Migraine without aura, with intractable migraine, so stated, without mention of status migrainosus 08/31/2013    Past Surgical History  Procedure Laterality Date  . Tonsillectomy    . Back surgery    . Nasal sinus surgery    . Abdominal hysterectomy    . Colonoscopy  2011    Dr. Arnoldo Morale: sessile polyp transverse colon (tubular adenoma)     Current Outpatient Prescriptions  Medication Sig Dispense Refill  . aspirin EC 81 MG tablet Take 81 mg by mouth daily.    . bimatoprost (LUMIGAN) 0.03 %  ophthalmic solution Place 1 drop into both eyes at bedtime.    . clobetasol cream (TEMOVATE) 0.05 % Use lightly as directed for vulvar irritation. Do not use over 2 weeks without stopping 30 g 0  . furosemide (LASIX) 40 MG tablet Take 40 mg by mouth once a week.     . hydrochlorothiazide (HYDRODIURIL) 25 MG tablet Take 25 mg by mouth daily.    . hydrocortisone (ANUSOL-HC) 2.5 % rectal cream Place 1 application rectally 2 (two) times daily as needed for hemorrhoids or itching.    . ramipril (ALTACE) 10 MG capsule Take 10 mg by mouth daily.    . simvastatin (ZOCOR) 40 MG tablet Take 40 mg by mouth daily.    Marland Kitchen topiramate (TOPAMAX) 25 MG tablet Take 25 mg by mouth at bedtime.    . peg 3350 powder (MOVIPREP) 100 G SOLR Take 1 kit (200 g total) by mouth as directed. 1 kit 0   No current facility-administered medications for this visit.    Allergies as of 05/09/2014 - Review Complete 05/09/2014  Allergen Reaction Noted  . Codeine Other (See Comments) 08/20/2013    Family History  Problem Relation Age of Onset  . Hypertension Mother   . Hypertension Father   . Cancer - Colon Mother   . Colon cancer Father     History   Social History  . Marital Status: Married  Spouse Name: N/A    Number of Children: 3  . Years of Education: hs   Occupational History  . Retired    Social History Main Topics  . Smoking status: Never Smoker   . Smokeless tobacco: Never Used  . Alcohol Use: No  . Drug Use: No  . Sexual Activity: Yes    Birth Control/ Protection: Surgical   Other Topics Concern  . Not on file   Social History Narrative   Patient is married with 3 children   Patient is right handed   Patient has a high school education   Patient does not drink any caffeine, occ chocolate candy    Review of Systems: Gen: Denies any fever, chills, fatigue, weight loss, lack of appetite.  CV: Denies chest pain, heart palpitations, peripheral edema, syncope.  Resp: Denies shortness of  breath at rest or with exertion. Denies wheezing or cough.  GI: see HPI GU : Denies urinary burning, urinary frequency, urinary hesitancy MS: +arthritis (shoulders, neck, knee) Derm: Denies rash, itching, dry skin Psych: Denies depression, anxiety, memory loss, and confusion Heme: Denies bruising, bleeding, and enlarged lymph nodes.  Physical Exam: BP 146/76 mmHg  Pulse 56  Temp(Src) 97 F (36.1 C)  Ht '5\' 2"'  (1.575 m)  Wt 218 lb 12.8 oz (99.247 kg)  BMI 40.01 kg/m2 General:   Alert and oriented. Pleasant and cooperative. Well-nourished and well-developed.  Head:  Normocephalic and atraumatic. Eyes:  Without icterus, sclera clear and conjunctiva pink.  Ears:  Normal auditory acuity. Nose:  No deformity, discharge,  or lesions. Mouth:  No deformity or lesions, oral mucosa pink.  Lungs:  Clear to auscultation bilaterally. No wheezes, rales, or rhonchi. No distress.  Heart:  S1, S2 present without murmurs appreciated.  Abdomen:  +BS, soft, non-tender and non-distended. No HSM noted. No guarding or rebound. Query rectus diastasis versus ventral hernia Rectal:  Deferred  Msk:  Symmetrical without gross deformities. Normal posture. Extremities:  Without  edema. Neurologic:  Alert and  oriented x4;  grossly normal neurologically. Skin:  Intact without significant lesions or rashes. Psych:  Alert and cooperative. Normal mood and affect.

## 2014-05-09 NOTE — Assessment & Plan Note (Signed)
In both father and mother. Colonoscopy as planned.

## 2014-05-30 ENCOUNTER — Encounter (HOSPITAL_COMMUNITY): Payer: Self-pay | Admitting: *Deleted

## 2014-05-30 ENCOUNTER — Encounter (HOSPITAL_COMMUNITY)
Admission: RE | Disposition: A | Discharge status: Home / Self Care | Payer: Self-pay | Source: Ambulatory Visit | Attending: Internal Medicine

## 2014-05-30 ENCOUNTER — Ambulatory Visit (HOSPITAL_COMMUNITY)
Admission: RE | Admit: 2014-05-30 | Discharge: 2014-05-30 | Disposition: A | Discharge status: Home / Self Care | Payer: Medicare Other | Source: Ambulatory Visit | Attending: Internal Medicine | Admitting: Internal Medicine

## 2014-05-30 DIAGNOSIS — K222 Esophageal obstruction: Secondary | ICD-10-CM | POA: Insufficient documentation

## 2014-05-30 DIAGNOSIS — Z886 Allergy status to analgesic agent status: Secondary | ICD-10-CM | POA: Diagnosis not present

## 2014-05-30 DIAGNOSIS — K648 Other hemorrhoids: Secondary | ICD-10-CM | POA: Diagnosis not present

## 2014-05-30 DIAGNOSIS — Z7982 Long term (current) use of aspirin: Secondary | ICD-10-CM | POA: Insufficient documentation

## 2014-05-30 DIAGNOSIS — D122 Benign neoplasm of ascending colon: Secondary | ICD-10-CM | POA: Diagnosis not present

## 2014-05-30 DIAGNOSIS — E780 Pure hypercholesterolemia: Secondary | ICD-10-CM | POA: Diagnosis not present

## 2014-05-30 DIAGNOSIS — Q394 Esophageal web: Secondary | ICD-10-CM | POA: Diagnosis not present

## 2014-05-30 DIAGNOSIS — G43909 Migraine, unspecified, not intractable, without status migrainosus: Secondary | ICD-10-CM | POA: Insufficient documentation

## 2014-05-30 DIAGNOSIS — Z860100 Personal history of colon polyps, unspecified: Secondary | ICD-10-CM | POA: Insufficient documentation

## 2014-05-30 DIAGNOSIS — K2100 Gastro-esophageal reflux disease with esophagitis, without bleeding: Secondary | ICD-10-CM | POA: Insufficient documentation

## 2014-05-30 DIAGNOSIS — I10 Essential (primary) hypertension: Secondary | ICD-10-CM | POA: Diagnosis not present

## 2014-05-30 DIAGNOSIS — K449 Diaphragmatic hernia without obstruction or gangrene: Secondary | ICD-10-CM | POA: Diagnosis not present

## 2014-05-30 DIAGNOSIS — R1314 Dysphagia, pharyngoesophageal phase: Secondary | ICD-10-CM

## 2014-05-30 DIAGNOSIS — D127 Benign neoplasm of rectosigmoid junction: Secondary | ICD-10-CM | POA: Diagnosis not present

## 2014-05-30 DIAGNOSIS — K5730 Diverticulosis of large intestine without perforation or abscess without bleeding: Secondary | ICD-10-CM | POA: Diagnosis not present

## 2014-05-30 DIAGNOSIS — D124 Benign neoplasm of descending colon: Secondary | ICD-10-CM | POA: Diagnosis not present

## 2014-05-30 DIAGNOSIS — K219 Gastro-esophageal reflux disease without esophagitis: Secondary | ICD-10-CM | POA: Insufficient documentation

## 2014-05-30 DIAGNOSIS — E119 Type 2 diabetes mellitus without complications: Secondary | ICD-10-CM | POA: Diagnosis not present

## 2014-05-30 DIAGNOSIS — Z1211 Encounter for screening for malignant neoplasm of colon: Secondary | ICD-10-CM | POA: Diagnosis not present

## 2014-05-30 DIAGNOSIS — Z800 Family history of malignant neoplasm of digestive organs: Secondary | ICD-10-CM | POA: Insufficient documentation

## 2014-05-30 DIAGNOSIS — K635 Polyp of colon: Secondary | ICD-10-CM | POA: Diagnosis not present

## 2014-05-30 HISTORY — PX: ESOPHAGOGASTRODUODENOSCOPY: SHX5428

## 2014-05-30 HISTORY — PX: COLONOSCOPY: SHX5424

## 2014-05-30 HISTORY — PX: MALONEY DILATION: SHX5535

## 2014-05-30 SURGERY — COLONOSCOPY
Anesthesia: Moderate Sedation

## 2014-05-30 MED ORDER — MIDAZOLAM HCL 5 MG/5ML IJ SOLN
INTRAMUSCULAR | Status: DC | PRN
  Administered 2014-05-30: 1 mg via INTRAVENOUS
  Administered 2014-05-30: 2 mg via INTRAVENOUS
  Administered 2014-05-30: 1 mg via INTRAVENOUS

## 2014-05-30 MED ORDER — ONDANSETRON HCL 4 MG/2ML IJ SOLN
INTRAMUSCULAR | Status: AC
  Filled 2014-05-30: qty 2

## 2014-05-30 MED ORDER — LIDOCAINE VISCOUS 2 % MT SOLN
OROMUCOSAL | Status: DC | PRN
  Administered 2014-05-30: 3 mL via OROMUCOSAL

## 2014-05-30 MED ORDER — MEPERIDINE HCL 100 MG/ML IJ SOLN
INTRAMUSCULAR | Status: AC
  Filled 2014-05-30: qty 2

## 2014-05-30 MED ORDER — ONDANSETRON HCL 4 MG/2ML IJ SOLN
INTRAMUSCULAR | Status: DC | PRN
  Administered 2014-05-30: 4 mg via INTRAVENOUS

## 2014-05-30 MED ORDER — STERILE WATER FOR IRRIGATION IR SOLN
Status: DC | PRN
  Administered 2014-05-30: 08:00:00

## 2014-05-30 MED ORDER — SODIUM CHLORIDE 0.9 % IV SOLN
INTRAVENOUS | Status: DC
  Administered 2014-05-30: 08:00:00 via INTRAVENOUS

## 2014-05-30 MED ORDER — MEPERIDINE HCL 100 MG/ML IJ SOLN
INTRAMUSCULAR | Status: DC | PRN
  Administered 2014-05-30: 25 mg via INTRAVENOUS
  Administered 2014-05-30: 50 mg via INTRAVENOUS

## 2014-05-30 MED ORDER — MIDAZOLAM HCL 5 MG/5ML IJ SOLN
INTRAMUSCULAR | Status: AC
  Filled 2014-05-30: qty 10

## 2014-05-30 MED ORDER — LIDOCAINE VISCOUS 2 % MT SOLN
OROMUCOSAL | Status: AC
  Filled 2014-05-30: qty 15

## 2014-05-30 NOTE — Interval H&P Note (Signed)
History and Physical Interval Note:  05/30/2014 8:25 AM  Kimberly Miranda  has presented today for surgery, with the diagnosis of family history  colon cancer dysphagia  The various methods of treatment have been discussed with the patient and family. After consideration of risks, benefits and other options for treatment, the patient has consented to  Procedure(s) with comments: COLONOSCOPY (N/A) - 830 ESOPHAGOGASTRODUODENOSCOPY (EGD) (N/A) SAVORY DILATION (N/A) MALONEY DILATION (N/A) as a surgical intervention .  The patient's history has been reviewed, patient examined, no change in status, stable for surgery.  I have reviewed the patient's chart and labs.  Questions were answered to the patient's satisfaction.     Kimberly Miranda   No change. No daily meds for GERD. EGD with esophageal dilation as appropriate and colonoscopy per plan.  The risks, benefits, limitations, imponderables and alternatives regarding both EGD and colonoscopy have been reviewed with the patient. Questions have been answered. All parties agreeable.

## 2014-05-30 NOTE — Discharge Instructions (Signed)
Colonoscopy Discharge Instructions  Read the instructions outlined below and refer to this sheet in the next few weeks. These discharge instructions provide you with general information on caring for yourself after you leave the hospital. Your doctor may also give you specific instructions. While your treatment has been planned according to the most current medical practices available, unavoidable complications occasionally occur. If you have any problems or questions after discharge, call Dr. Gala Romney at [phone]. ACTIVITY  You may resume your regular activity, but move at a slower pace for the next 24 hours.   Take frequent rest periods for the next 24 hours.   Walking will help get rid of the air and reduce the bloated feeling in your belly (abdomen).   No driving for 24 hours (because of the medicine (anesthesia) used during the test).    Do not sign any important legal documents or operate any machinery for 24 hours (because of the anesthesia used during the test).  NUTRITION  Drink plenty of fluids.   You may resume your normal diet as instructed by your doctor.   Begin with a light meal and progress to your normal diet. Heavy or fried foods are harder to digest and may make you feel sick to your stomach (nauseated).   Avoid alcoholic beverages for 24 hours or as instructed.  MEDICATIONS  You may resume your normal medications unless your doctor tells you otherwise.  WHAT YOU CAN EXPECT TODAY  Some feelings of bloating in the abdomen.   Passage of more gas than usual.   Spotting of blood in your stool or on the toilet paper.  IF YOU HAD POLYPS REMOVED DURING THE COLONOSCOPY:  No aspirin products for 7 days or as instructed.   No alcohol for 7 days or as instructed.   Eat a soft diet for the next 24 hours.  FINDING OUT THE RESULTS OF YOUR TEST Not all test results are available during your visit. If your test results are not back during the visit, make an appointment  with your caregiver to find out the results. Do not assume everything is normal if you have not heard from your caregiver or the medical facility. It is important for you to follow up on all of your test results.  SEEK IMMEDIATE MEDICAL ATTENTION IF:  You have more than a spotting of blood in your stool.   Your belly is swollen (abdominal distention).   You are nauseated or vomiting.   You have a temperature over 101.  You have abdominal pain or discomfort that is severe or gets worse throughout the day. EGD Discharge instructions Please read the instructions outlined below and refer to this sheet in the next few weeks. These discharge instructions provide you with general information on caring for yourself after you leave the hospital. Your doctor may also give you specific instructions. While your treatment has been planned according to the most current medical practices available, unavoidable complications occasionally occur. If you have any problems or questions after discharge, please call your doctor. ACTIVITY You may resume your regular activity but move at a slower pace for the next 24 hours.  Take frequent rest periods for the next 24 hours.  Walking will help expel (get rid of) the air and reduce the bloated feeling in your abdomen.  No driving for 24 hours (because of the anesthesia (medicine) used during the test).  You may shower.  Do not sign any important legal documents or operate any machinery for 24  hours (because of the anesthesia used during the test).  NUTRITION Drink plenty of fluids.  You may resume your normal diet.  Begin with a light meal and progress to your normal diet.  Avoid alcoholic beverages for 24 hours or as instructed by your caregiver.  MEDICATIONS You may resume your normal medications unless your caregiver tells you otherwise.  WHAT YOU CAN EXPECT TODAY You may experience abdominal discomfort such as a feeling of fullness or gas pains.   FOLLOW-UP Your doctor will discuss the results of your test with you.  SEEK IMMEDIATE MEDICAL ATTENTION IF ANY OF THE FOLLOWING OCCUR: Excessive nausea (feeling sick to your stomach) and/or vomiting.  Severe abdominal pain and distention (swelling).  Trouble swallowing.  Temperature over 101 F (37.8 C).  Rectal bleeding or vomiting of blood.      GERD, diverticulosis and polyp information provided  Begin Protonix 40 mg daily  Further recommendations to follow pending review of pathology report  Office visit with [place] in 6 months.  Diverticulosis Diverticulosis is the condition that develops when small pouches (diverticula) form in the wall of your colon. Your colon, or large intestine, is where water is absorbed and stool is formed. The pouches form when the inside layer of your colon pushes through weak spots in the outer layers of your colon. CAUSES  No one knows exactly what causes diverticulosis. RISK FACTORS  Being older than 65. Your risk for this condition increases with age. Diverticulosis is rare in people younger than 40 years. By age 34, almost everyone has it.  Eating a low-fiber diet.  Being frequently constipated.  Being overweight.  Not getting enough exercise.  Smoking.  Taking over-the-counter pain medicines, like aspirin and ibuprofen. SYMPTOMS  Most people with diverticulosis do not have symptoms. DIAGNOSIS  Because diverticulosis often has no symptoms, health care providers often discover the condition during an exam for other colon problems. In many cases, a health care provider will diagnose diverticulosis while using a flexible scope to examine the colon (colonoscopy). TREATMENT  If you have never developed an infection related to diverticulosis, you may not need treatment. If you have had an infection before, treatment may include:  Eating more fruits, vegetables, and grains.  Taking a fiber supplement.  Taking a live bacteria supplement  (probiotic).  Taking medicine to relax your colon. HOME CARE INSTRUCTIONS   Drink at least 6-8 glasses of water each day to prevent constipation.  Try not to strain when you have a bowel movement.  Keep all follow-up appointments. If you have had an infection before:  Increase the fiber in your diet as directed by your health care provider or dietitian.  Take a dietary fiber supplement if your health care provider approves.  Only take medicines as directed by your health care provider. SEEK MEDICAL CARE IF:   You have abdominal pain.  You have bloating.  You have cramps.  You have not gone to the bathroom in 3 days. SEEK IMMEDIATE MEDICAL CARE IF:   Your pain gets worse.  Yourbloating becomes very bad.  You have a fever or chills, and your symptoms suddenly get worse.  You begin vomiting.  You have bowel movements that are bloody or black. MAKE SURE YOU:  Understand these instructions.  Will watch your condition.  Will get help right away if you are not doing well or get worse. Document Released: 01/16/2004 Document Revised: 04/25/2013 Document Reviewed: 03/15/2013 [facility] Patient Information 2015 [place], [place]. This information is not intended  to replace advice given to you by your health care provider. Make sure you discuss any questions you have with your health care provider.    Colon Polyps Polyps are lumps of extra tissue growing inside the body. Polyps can grow in the large intestine (colon). Most colon polyps are noncancerous (benign). However, some colon polyps can become cancerous over time. Polyps that are larger than a pea may be harmful. To be safe, caregivers remove and test all polyps. CAUSES  Polyps form when mutations in the genes cause your cells to grow and divide even though no more tissue is needed. RISK FACTORS There are a number of risk factors that can increase your chances of getting colon polyps. They include:  Being older than 50  years.  Family history of colon polyps or colon cancer.  Long-term colon diseases, such as colitis or Crohn disease.  Being overweight.  Smoking.  Being inactive.  Drinking too much alcohol. SYMPTOMS  Most small polyps do not cause symptoms. If symptoms are present, they may include:  Blood in the stool. The stool may look dark red or black.  Constipation or diarrhea that lasts longer than 1 week. DIAGNOSIS People often do not know they have polyps until their caregiver finds them during a regular checkup. Your caregiver can use 4 tests to check for polyps:  Digital rectal exam. The caregiver wears gloves and feels inside the rectum. This test would find polyps only in the rectum.  Barium enema. The caregiver puts a liquid called barium into your rectum before taking X-rays of your colon. Barium makes your colon look white. Polyps are dark, so they are easy to see in the X-ray pictures.  Sigmoidoscopy. A thin, flexible tube (sigmoidoscope) is placed into your rectum. The sigmoidoscope has a light and tiny camera in it. The caregiver uses the sigmoidoscope to look at the last third of your colon.  Colonoscopy. This test is like sigmoidoscopy, but the caregiver looks at the entire colon. This is the most common method for finding and removing polyps. TREATMENT  Any polyps will be removed during a sigmoidoscopy or colonoscopy. The polyps are then tested for cancer. PREVENTION  To help lower your risk of getting more colon polyps:  Eat plenty of fruits and vegetables. Avoid eating fatty foods.  Do not smoke.  Avoid drinking alcohol.  Exercise every day.  Lose weight if recommended by your caregiver.  Eat plenty of calcium and folate. Foods that are rich in calcium include milk, cheese, and broccoli. Foods that are rich in folate include chickpeas, kidney beans, and spinach. HOME CARE INSTRUCTIONS Keep all follow-up appointments as directed by your caregiver. You may need  periodic exams to check for polyps. SEEK MEDICAL CARE IF: You notice bleeding during a bowel movement. Document Released: 01/15/2004 Document Revised: 07/13/2011 Document Reviewed: 06/30/2011 [facility] Patient Information 2015 [place], [place]. This information is not intended to replace advice given to you by your health care provider. Make sure you discuss any questions you have with your health care provider.  Gastroesophageal Reflux Disease, Adult Gastroesophageal reflux disease (GERD) happens when acid from your stomach flows up into the esophagus. When acid comes in contact with the esophagus, the acid causes soreness (inflammation) in the esophagus. Over time, GERD may create small holes (ulcers) in the lining of the esophagus. CAUSES   Increased body weight. This puts pressure on the stomach, making acid rise from the stomach into the esophagus.  Smoking. This increases acid production in the  stomach.  Drinking alcohol. This causes decreased pressure in the lower esophageal sphincter (valve or ring of muscle between the esophagus and stomach), allowing acid from the stomach into the esophagus.  Late evening meals and a full stomach. This increases pressure and acid production in the stomach.  A malformed lower esophageal sphincter. Sometimes, no cause is found. SYMPTOMS   Burning pain in the lower part of the mid-chest behind the breastbone and in the mid-stomach area. This may occur twice a week or more often.  Trouble swallowing.  Sore throat.  Dry cough.  Asthma-like symptoms including chest tightness, shortness of breath, or wheezing. DIAGNOSIS  Your caregiver may be able to diagnose GERD based on your symptoms. In some cases, X-rays and other tests may be done to check for complications or to check the condition of your stomach and esophagus. TREATMENT  Your caregiver may recommend over-the-counter or prescription medicines to help decrease acid production. Ask your  caregiver before starting or adding any new medicines.  HOME CARE INSTRUCTIONS   Change the factors that you can control. Ask your caregiver for guidance concerning weight loss, quitting smoking, and alcohol consumption.  Avoid foods and drinks that make your symptoms worse, such as:  Caffeine or alcoholic drinks.  Chocolate.  Peppermint or mint flavorings.  Garlic and onions.  Spicy foods.  Citrus fruits, such as oranges, lemons, or limes.  Tomato-based foods such as sauce, chili, salsa, and pizza.  Fried and fatty foods.  Avoid lying down for the 3 hours prior to your bedtime or prior to taking a nap.  Eat small, frequent meals instead of large meals.  Wear loose-fitting clothing. Do not wear anything tight around your waist that causes pressure on your stomach.  Raise the head of your bed 6 to 8 inches with wood blocks to help you sleep. Extra pillows will not help.  Only take over-the-counter or prescription medicines for pain, discomfort, or fever as directed by your caregiver.  Do not take aspirin, ibuprofen, or other nonsteroidal anti-inflammatory drugs (NSAIDs). SEEK IMMEDIATE MEDICAL CARE IF:   You have pain in your arms, neck, jaw, teeth, or back.  Your pain increases or changes in intensity or duration.  You develop nausea, vomiting, or sweating (diaphoresis).  You develop shortness of breath, or you faint.  Your vomit is green, yellow, black, or looks like coffee grounds or blood.  Your stool is red, bloody, or black. These symptoms could be signs of other problems, such as heart disease, gastric bleeding, or esophageal bleeding. MAKE SURE YOU:   Understand these instructions.  Will watch your condition.  Will get help right away if you are not doing well or get worse. Document Released: 01/28/2005 Document Revised: 07/13/2011 Document Reviewed: 11/07/2010 [facility] Patient Information 2015 [place], [place]. This information is not intended to replace  advice given to you by your health care provider. Make sure you discuss any questions you have with your health care provider.

## 2014-05-30 NOTE — Op Note (Signed)
[facility] [address] [place], [address]   COLONOSCOPY PROCEDURE REPORT  PATIENT: Kimberly Miranda, Kimberly Miranda  MR#: [id number] BIRTHDATE: July 17, 1940 , 62  yrs. old GENDER: female ENDOSCOPIST: R.  Garfield Cornea, MD FACP [facility] REFERRED BY:Roy Willey Blade, M.D. PROCEDURE DATE:  05/30/2014 PROCEDURE:   Ileocolonoscopy with biopsy and Colonoscopy with snare polypectomy ASA CLASS:   Class II INDICATIONS:Surveillance examination; history of colonic adenoma; positive family history of colon cancer in 2 first-degree relatives. MEDICATIONS: Versed 4 mg IV and Demerol 75 mg IV in divided doses. Zofran 4 Mg IV.  DESCRIPTION OF PROCEDURE:   After the risks benefits and alternatives of the procedure were thoroughly explained, informed consent was obtained.  The digital rectal exam revealed no abnormalities of the rectum and revealed no rectal mass.   The EG-[id number] ([id number])  endoscope was introduced through the anus and advanced to the terminal ileum which was intubated for a short distance. No adverse events experienced.   The quality of the prep was adequate.  The instrument was then slowly withdrawn as the colon was fully examined.      COLON FINDINGS: Anal papilla and internal hemorrhoids; otherwise normal rectal mucosa.  At the rectosigmoid junction, there were 3 diminutive polyps.  Scattered left-sided diverticula; (1) 6 mm sessile polyp in the mid descending segment and (1) 4 mm polyp in the mid ascending segment; otherwise, the remainder of the colonic mucosa appeared normal.  The distal 5 cm of terminal ileal mucosa also appeared normal.  Rectosigmoid polyps were cold biopsy removed, the descending colon polyp was hot snare removed and, finally, the ascending colon was cold snared and removed.  Retroflexion was performed.    Withdrawal time=18 minutes 0 seconds.  The scope was withdrawn and the procedure completed. COMPLICATIONS: There were no immediate  complications.  ENDOSCOPIC IMPRESSION: Multiple colonic polyps removed as described above. Colonic diverticulosis. Internal hemorrhoids.  RECOMMENDATIONS: Follow-up on pathology. See EGD report.  eSigned:  R. Garfield Cornea, MD FACP [facility] 05/30/2014 9:31 AM   cc:

## 2014-05-30 NOTE — Op Note (Signed)
[facility] [address] [place], [address]   ENDOSCOPY PROCEDURE REPORT  PATIENT: Kimberly, Miranda  MR#: [id number] BIRTHDATE: Sep 20, 1940 , 31  yrs. old GENDER: female ENDOSCOPIST: R.  Garfield Cornea, MD FACP [facility] REFERRED BY:  Asencion Noble, M.D. PROCEDURE DATE:  06-Jun-2014 PROCEDURE:  EGD, diagnostic and Maloney dilation of esophagus INDICATIONS:  Chronic esophageal dysphagia. MEDICATIONS: Versed 4 mg IV and Demerol 75 mg IV in divided doses. Xylocaine gel orally.  Zofran 4 mg IV. ASA CLASS:      Class II  CONSENT: The risks, benefits, limitations, alternatives and imponderables have been discussed.  The potential for biopsy, esophogeal dilation, etc. have also been reviewed.  Questions have been answered.  All parties agreeable.  Please see the history and physical in the medical record for more information.  DESCRIPTION OF PROCEDURE: After the risks benefits and alternatives of the procedure were thoroughly explained, informed consent was obtained.  The EG-2990i ([id number]) endoscope was introduced through the mouth and advanced to the second portion of the duodenum , limited by Without limitations. The instrument was slowly withdrawn as the mucosa was fully examined.    Circumferential distal esophageal ring with superimposed esophageal erosions.  No Barrett's esophagus.  No tumor.  I traversed the esophagus with the adult scope without any difficulty.  Stomach empty.  Small hiatal hernia.  Normal gastric mucosa.  Patent pylorus.  Normal first and second portion of the duodenum. Retroflexed views revealed as previously described.  Scope was removed and a 27 [place] Maloney dilator was passed to full insertion easily. A look back revealed the ring had been nicely ruptured with minimal bleeding and without apparent complication. The scope was then withdrawn from the patient and the procedure completed.  COMPLICATIONS: There were no immediate  complications.  ENDOSCOPIC IMPRESSION: Schatzki's ring with superimposed erosive reflux esophagitis?"status post Maloney dilation. Hiatal hernia.  RECOMMENDATIONS: Swallowing precautions reviewed. Begin Protonix 40 mg daily. Office visit in 6 months. See colonoscopy report.  REPEAT EXAM:  eSigned:  R. Garfield Cornea, MD [facility]al[facility]ss[facility] 2014/06/06 8:58 AM    CC:  CPT CODES: ICD CODES:  The ICD and CPT codes recommended by this software are interpretations from the data that the clinical staff has captured with the software.  The verification of the translation of this report to the ICD and CPT codes and modifiers is the sole responsibility of the health care institution and practicing physician where this report was generated.  [employer] will not be held responsible for the validity of the ICD and CPT codes included on this report.  AMA assumes no liability for data contained or not contained herein. CPT is a Designer, television/film set of the [employer].  PATIENT NAME:  Kimberly, Miranda MR#: [id number]

## 2014-05-30 NOTE — H&P (View-Only) (Signed)
Primary Care Physician:  Kimberly Noble, MD Primary Gastroenterologist:  Dr. Gala Miranda   Chief Complaint  Patient presents with  . Dysphagia  . set up colonoscopy    HPI:   ELYSABETH Miranda is a 74 y.o. female presenting today at the request of Kimberly Miranda to set up surveillance colonoscopy and EGD. History of colonoscopy in 2011 by Dr. Arnoldo Miranda with tubular adenoma. She has a family history of colon cancer in [place] her father and mother. Due for high risk surveillance now.   Recurrent choking, 2-3 times per month for the past year or two. Has remote history of esophageal dilatation in past by Dr. Arnoldo Miranda. Usually noted while eating out, normally when first started eating. No liquid dysphagia. States will sit still and can feel the food slowly work itself down. Will have to regurgitate to get relief if out in public. No odynophagia. Small amount of reflux symptoms in the last few weeks. Takes an OTC agent similar to Tums. Only nocturnal episodes, "not bad". No lack of appetite or weight loss. States she actually gained 10 lbs over Christmas. Eating late at night.   No concerning lower GI symptoms. Will occasionally take Miralax for mild constipation. No rectal bleeding.   Past Medical History  Diagnosis Date  . High cholesterol   . Hypertension   . Vitiligo   . Nephrolithiasis   . Other specified cardiac dysrhythmias(427.89)   . Edema   . GERD (gastroesophageal reflux disease)   . Diabetes   . Migraine without aura, with intractable migraine, so stated, without mention of status migrainosus 08/31/2013    Past Surgical History  Procedure Laterality Date  . Tonsillectomy    . Back surgery    . Nasal sinus surgery    . Abdominal hysterectomy    . Colonoscopy  2011    Dr. Arnoldo Miranda: sessile polyp transverse colon (tubular adenoma)     Current Outpatient Prescriptions  Medication Sig Dispense Refill  . aspirin EC 81 MG tablet Take 81 mg by mouth daily.    . bimatoprost (LUMIGAN) 0.03 %  ophthalmic solution Place 1 drop into both eyes at bedtime.    . clobetasol cream (TEMOVATE) 0.05 % Use lightly as directed for vulvar irritation. Do not use over 2 weeks without stopping 30 g 0  . furosemide (LASIX) 40 MG tablet Take 40 mg by mouth once a week.     . hydrochlorothiazide (HYDRODIURIL) 25 MG tablet Take 25 mg by mouth daily.    . hydrocortisone (ANUSOL-HC) 2.5 % rectal cream Place 1 application rectally 2 (two) times daily as needed for hemorrhoids or itching.    . ramipril (ALTACE) 10 MG capsule Take 10 mg by mouth daily.    . simvastatin (ZOCOR) 40 MG tablet Take 40 mg by mouth daily.    Marland Kitchen topiramate (TOPAMAX) 25 MG tablet Take 25 mg by mouth at bedtime.    . peg 3350 powder (MOVIPREP) 100 G SOLR Take 1 kit (200 g total) by mouth as directed. 1 kit 0   No current facility-administered medications for this visit.    Allergies as of 05/09/2014 - Review Complete 05/09/2014  Allergen Reaction Noted  . Codeine Other (See Comments) 08/20/2013    Family History  Problem Relation Age of Onset  . Hypertension Mother   . Hypertension Father   . Cancer - Colon Mother   . Colon cancer Father     History   Social History  . Marital Status: Married  Spouse Name: N/A    Number of Children: 3  . Years of Education: hs   Occupational History  . Retired    Social History Main Topics  . Smoking status: Never Smoker   . Smokeless tobacco: Never Used  . Alcohol Use: No  . Drug Use: No  . Sexual Activity: Yes    Birth Control/ Protection: Surgical   Other Topics Concern  . Not on file   Social History Narrative   Patient is married with 3 children   Patient is right handed   Patient has a high school education   Patient does not drink any caffeine, occ chocolate candy    Review of Systems: Gen: Denies any fever, chills, fatigue, weight loss, lack of appetite.  CV: Denies chest pain, heart palpitations, peripheral edema, syncope.  Resp: Denies shortness of  breath at rest or with exertion. Denies wheezing or cough.  GI: see HPI GU : Denies urinary burning, urinary frequency, urinary hesitancy MS: +arthritis (shoulders, neck, knee) Derm: Denies rash, itching, dry skin Psych: Denies depression, anxiety, memory loss, and confusion Heme: Denies bruising, bleeding, and enlarged lymph nodes.  Physical Exam: BP 146/76 mmHg  Pulse 56  Temp(Src) 97 F (36.1 C)  Ht '5\' 2"'  (1.575 m)  Wt 218 lb 12.8 oz (99.247 kg)  BMI 40.01 kg/m2 General:   Alert and oriented. Pleasant and cooperative. Well-nourished and well-developed.  Head:  Normocephalic and atraumatic. Eyes:  Without icterus, sclera clear and conjunctiva pink.  Ears:  Normal auditory acuity. Nose:  No deformity, discharge,  or lesions. Mouth:  No deformity or lesions, oral mucosa pink.  Lungs:  Clear to auscultation bilaterally. No wheezes, rales, or rhonchi. No distress.  Heart:  S1, S2 present without murmurs appreciated.  Abdomen:  +BS, soft, non-tender and non-distended. No HSM noted. No guarding or rebound. Query rectus diastasis versus ventral hernia Rectal:  Deferred  Msk:  Symmetrical without gross deformities. Normal posture. Extremities:  Without  edema. Neurologic:  Alert and  oriented x4;  grossly normal neurologically. Skin:  Intact without significant lesions or rashes. Psych:  Alert and cooperative. Normal mood and affect.

## 2014-06-01 ENCOUNTER — Encounter: Payer: Self-pay | Admitting: Internal Medicine

## 2014-06-01 ENCOUNTER — Encounter (HOSPITAL_COMMUNITY): Payer: Self-pay | Admitting: Internal Medicine

## 2014-06-06 ENCOUNTER — Ambulatory Visit (INDEPENDENT_AMBULATORY_CARE_PROVIDER_SITE_OTHER): Payer: Medicare Other | Admitting: Adult Health

## 2014-06-06 ENCOUNTER — Encounter: Payer: Self-pay | Admitting: Adult Health

## 2014-06-06 VITALS — BP 122/68 | HR 62 | Ht 62.5 in | Wt 214.8 lb

## 2014-06-06 DIAGNOSIS — G43009 Migraine without aura, not intractable, without status migrainosus: Secondary | ICD-10-CM

## 2014-06-06 MED ORDER — TOPIRAMATE 25 MG PO TABS: 25.0000 mg | ORAL_TABLET | Freq: Every day | ORAL | Status: DC

## 2014-06-06 NOTE — Progress Notes (Signed)
I have read the note, and I agree with the clinical assessment and plan.  Deysha Cartier KEITH   

## 2014-06-06 NOTE — Progress Notes (Signed)
PATIENT: Kimberly Miranda DOB: December 15, 1940  REASON FOR VISIT: follow up- migraine HISTORY FROM: patient  HISTORY OF PRESENT ILLNESS:Kimberly Miranda is a 74 year old female with a history of headaches. She returns today for follow-up. She is currently taking Topamax 25 mg daily. Patient states that her headaches have resolved with Topamax. She states that she has not had a headache since the last visit. She reports that she does have a cold right now that she is battling. She also had her esophagus stretched- they did remove some polyps. Otherwise she is doing well.   HISTORY 12/04/13: Kimberly Miranda is a 74 year old female with a history of headaches. She returns today for follow-up. She is currently taking Topamax 68m daily. Patient states that her headaches have improved. She reports that she has had no headaches since starting Topamax. She states that she takes usually only 1 pill a day but if she remembers she will take two. She states that when she increased to 4 tablets a daily so had nausea and vomiting. No new medical issues since last seen. She does have some shoulder pain but has been prescribed some cream that is working well for her.   HISTORY 08/31/13:  history of intermittent headaches in the past. The patient indicates that she will get 2 headaches a year, sometimes associated with nausea. The patient had some headaches for 15-20 years. Over the last 5 weeks, the patient indicates that her headaches have converted to a daily headache. The headache came on gradually, not suddenly, and headache includes the entirety of the head. She indicates that she does have photophobia and some mild photophobia, but she also has some nausea and vomiting. The patient indicates that when she stands up, the nausea worsens, and she has to lay down. She denies neck stiffness, confusion, visual loss with the headache. There is no focal numbness or weakness of the extremities. She has undergone a course of prednisone  without improvement. She recently underwent MRI evaluation of the brain that was unremarkable. She is sent to this office for further evaluation. She is on Percocet for the pain without complete control. She is taking Zofran for nausea.   REVIEW OF SYSTEMS: Out of a complete 14 system review of symptoms, the patient complains only of the following symptoms, and all other reviewed systems are negative.  ALLERGIES: Allergies  Allergen Reactions  . Codeine Other (See Comments)    Stomach upset    HOME MEDICATIONS: Outpatient Prescriptions Prior to Visit  Medication Sig Dispense Refill  . aspirin EC 81 MG tablet Take 81 mg by mouth daily.    . bimatoprost (LUMIGAN) 0.03 % ophthalmic solution Place 1 drop into both eyes at bedtime.    . clobetasol cream (TEMOVATE) 0.05 % Use lightly as directed for vulvar irritation. Do not use over 2 weeks without stopping 30 g 0  . furosemide (LASIX) 40 MG tablet Take 40 mg by mouth as needed for fluid.     . hydrochlorothiazide (HYDRODIURIL) 25 MG tablet Take 25 mg by mouth daily.    . hydrocortisone (ANUSOL-HC) 2.5 % rectal cream Place 1 application rectally 2 (two) times daily as needed for hemorrhoids or itching.    . Multiple Vitamin (MULTIVITAMIN WITH MINERALS) TABS tablet Take 1 tablet by mouth daily.    . peg 3350 powder (MOVIPREP) 100 G SOLR Take 1 kit (200 g total) by mouth as directed. 1 kit 0  . ramipril (ALTACE) 10 MG capsule Take  10 mg by mouth daily.    . simvastatin (ZOCOR) 40 MG tablet Take 40 mg by mouth daily.    Marland Kitchen topiramate (TOPAMAX) 25 MG tablet Take 25 mg by mouth at bedtime.     No facility-administered medications prior to visit.    PAST MEDICAL HISTORY: Past Medical History  Diagnosis Date  . High cholesterol   . Hypertension   . Vitiligo   . Nephrolithiasis   . Other specified cardiac dysrhythmias(427.89)   . Edema   . GERD (gastroesophageal reflux disease)   . Diabetes   . Migraine without aura, with intractable  migraine, so stated, without mention of status migrainosus 08/31/2013    PAST SURGICAL HISTORY: Past Surgical History  Procedure Laterality Date  . Tonsillectomy    . Back surgery    . Nasal sinus surgery    . Abdominal hysterectomy    . Colonoscopy  2011    Dr. Arnoldo Morale: sessile polyp transverse colon (tubular adenoma)   . Colonoscopy N/A 05/30/2014    Procedure: COLONOSCOPY;  Surgeon: Daneil Dolin, MD;  Location: [facility];  Service: Endoscopy;  Laterality: N/A;  830  . Esophagogastroduodenoscopy N/A 05/30/2014    Procedure: ESOPHAGOGASTRODUODENOSCOPY (EGD);  Surgeon: Daneil Dolin, MD;  Location: [facility];  Service: Endoscopy;  Laterality: N/A;  Venia Minks dilation N/A 05/30/2014    Procedure: Venia Minks DILATION;  Surgeon: Daneil Dolin, MD;  Location: [facility];  Service: Endoscopy;  Laterality: N/A;    FAMILY HISTORY: Family History  Problem Relation Age of Onset  . Hypertension Mother   . Hypertension Father   . Cancer - Colon Mother   . Colon cancer Father        PHYSICAL EXAM  Filed Vitals:   06/06/14 1115  BP: 122/68  Pulse: 62  Height: 5' 2.5" (1.588 m)  Weight: 214 lb 12 oz (97.41 kg)   Body mass index is 38.63 kg/(m^2).  Generalized: Well developed, in no acute distress   Neurological examination  Mentation: Alert oriented to time, place, history taking. Follows all commands speech and language fluent Cranial nerve II-XII: Pupils were equal round reactive to light. Extraocular movements were full, visual field were full on confrontational test. Facial sensation and strength were normal. Uvula tongue midline. Head turning and shoulder shrug  were normal and symmetric. Motor: The motor testing reveals 5 over 5 strength of all 4 extremities. Good symmetric motor tone is noted throughout.  Sensory: Sensory testing is intact to soft touch on all 4 extremities. No evidence of extinction is noted.  Coordination: Cerebellar testing reveals good  finger-nose-finger and heel-to-shin bilaterally.  Gait and station: Gait is normal. Tandem gait is normal. Romberg is negative. No drift is seen.  Reflexes: Deep tendon reflexes are symmetric and normal bilaterally.     DIAGNOSTIC DATA (LABS, IMAGING, TESTING) - I reviewed patient records, labs, notes, testing and imaging myself where available.       ASSESSMENT AND PLAN 74 y.o. year old female  has a past medical history of High cholesterol; Hypertension; Vitiligo; Nephrolithiasis; Other specified cardiac dysrhythmias(427.89); Edema; GERD (gastroesophageal reflux disease); Diabetes; and Migraine without aura, with intractable migraine, so stated, without mention of status migrainosus (08/31/2013). here with:  1. Migraine Headaches  Headaches have been controlled with Topamax. Continue Topamax 25 mg at bedtime. Refill sent. If your cold & chest congestion do not improve within 1 week please make your PCP aware.  If you headaches return, please let us know.  F/U in 1 year or sooner if needed.   Ward Givens, MSN, NP-C 06/06/2014, 12:02 PM [facility] [address] [place], [place] [address] [phone]  Note: This document was prepared with digital dictation and possible smart phrase technology. Any transcriptional errors that result from this process are unintentional.

## 2014-06-06 NOTE — Patient Instructions (Signed)
Continue Topamax. If you cold & chest congestion do not improve in 1 week please see your PCP.  If your headaches returns please let us know.

## 2014-07-09 ENCOUNTER — Encounter: Payer: Self-pay | Admitting: Internal Medicine

## 2014-07-23 DIAGNOSIS — H4011X3 Primary open-angle glaucoma, severe stage: Secondary | ICD-10-CM | POA: Diagnosis not present

## 2014-08-11 DIAGNOSIS — J019 Acute sinusitis, unspecified: Secondary | ICD-10-CM | POA: Diagnosis not present

## 2014-08-11 DIAGNOSIS — J209 Acute bronchitis, unspecified: Secondary | ICD-10-CM | POA: Diagnosis not present

## 2014-10-05 DIAGNOSIS — M25511 Pain in right shoulder: Secondary | ICD-10-CM | POA: Diagnosis not present

## 2014-10-19 DIAGNOSIS — K219 Gastro-esophageal reflux disease without esophagitis: Secondary | ICD-10-CM | POA: Diagnosis not present

## 2014-10-19 DIAGNOSIS — E119 Type 2 diabetes mellitus without complications: Secondary | ICD-10-CM | POA: Diagnosis not present

## 2014-10-19 DIAGNOSIS — E785 Hyperlipidemia, unspecified: Secondary | ICD-10-CM | POA: Diagnosis not present

## 2014-10-19 DIAGNOSIS — I10 Essential (primary) hypertension: Secondary | ICD-10-CM | POA: Diagnosis not present

## 2014-10-19 DIAGNOSIS — Z79899 Other long term (current) drug therapy: Secondary | ICD-10-CM | POA: Diagnosis not present

## 2014-10-26 DIAGNOSIS — I10 Essential (primary) hypertension: Secondary | ICD-10-CM | POA: Diagnosis not present

## 2014-10-26 DIAGNOSIS — E785 Hyperlipidemia, unspecified: Secondary | ICD-10-CM | POA: Diagnosis not present

## 2014-10-26 DIAGNOSIS — E1129 Type 2 diabetes mellitus with other diabetic kidney complication: Secondary | ICD-10-CM | POA: Diagnosis not present

## 2014-11-20 DIAGNOSIS — H4011X3 Primary open-angle glaucoma, severe stage: Secondary | ICD-10-CM | POA: Diagnosis not present

## 2014-11-28 ENCOUNTER — Ambulatory Visit: Payer: Medicare Other | Admitting: Gastroenterology

## 2014-12-03 ENCOUNTER — Encounter: Payer: Self-pay | Admitting: Gastroenterology

## 2014-12-03 ENCOUNTER — Ambulatory Visit (INDEPENDENT_AMBULATORY_CARE_PROVIDER_SITE_OTHER): Payer: Medicare Other | Admitting: Gastroenterology

## 2014-12-03 VITALS — BP 133/77 | HR 54 | Temp 97.3°F | Ht 62.0 in | Wt 214.6 lb

## 2014-12-03 DIAGNOSIS — K2100 Gastro-esophageal reflux disease with esophagitis, without bleeding: Secondary | ICD-10-CM

## 2014-12-03 DIAGNOSIS — Z800 Family history of malignant neoplasm of digestive organs: Secondary | ICD-10-CM

## 2014-12-03 MED ORDER — PANTOPRAZOLE SODIUM 40 MG PO TBEC: 40.0000 mg | DELAYED_RELEASE_TABLET | Freq: Every day | ORAL | Status: DC

## 2014-12-03 NOTE — Patient Instructions (Signed)
Continue Protonix once daily. In a few months, you can try to wean off of this. If symptoms recur, you will need to restart.  Your next colonoscopy will be in 5 years. We will send you a reminder letter.   We will see you back as needed!

## 2014-12-04 ENCOUNTER — Encounter: Payer: Self-pay | Admitting: Gastroenterology

## 2014-12-04 ENCOUNTER — Telehealth: Payer: Self-pay | Admitting: Gastroenterology

## 2014-12-04 NOTE — Progress Notes (Signed)
Referring Provider: Asencion Noble, MD Primary Care Physician:  Asencion Noble, MD  Primary GI: Dr. Gala Romney   Chief Complaint  Patient presents with  . Follow-up    HPI:   Kimberly Miranda is a 74 y.o. female presenting today in follow-up after colonoscopy and EGD. Multiple adenomas on recent colonoscopy, with next surveillance in 5 years if health permits. Family history of colon cancer in both father and mother. EGD with Schatzki's ring s/p dilation and erosive reflux esophagitis.   Doing well. No lower or upper GI complaints. No further dysphagia. Continues Protonix.   Past Medical History  Diagnosis Date  . High cholesterol   . Hypertension   . Vitiligo   . Nephrolithiasis   . Other specified cardiac dysrhythmias(427.89)   . Edema   . GERD (gastroesophageal reflux disease)   . Diabetes   . Migraine without aura, with intractable migraine, so stated, without mention of status migrainosus 08/31/2013    Past Surgical History  Procedure Laterality Date  . Tonsillectomy    . Back surgery    . Nasal sinus surgery    . Abdominal hysterectomy    . Colonoscopy  2011    Dr. Arnoldo Morale: sessile polyp transverse colon (tubular adenoma)   . Colonoscopy N/A 05/30/2014    Dr. Rourk:multiple colonic polyps/colonic diverticulosis/internal hemorrhoids. Tubular adenomas. Surveillance in 5 years if health permits.   . Esophagogastroduodenoscopy N/A 05/30/2014    Dr. Rourk:schatzki's ring with superimposed erosive reflux esophagitis, s/p dilation. hiatal hernia  . Maloney dilation N/A 05/30/2014    Procedure: Venia Minks DILATION;  Surgeon: Daneil Dolin, MD;  Location: [facility];  Service: Endoscopy;  Laterality: N/A;    Current Outpatient Prescriptions  Medication Sig Dispense Refill  . aspirin EC 81 MG tablet Take 81 mg by mouth daily.    . bimatoprost (LUMIGAN) 0.03 % ophthalmic solution Place 1 drop into both eyes at bedtime.    . clobetasol cream (TEMOVATE) 0.05 % Use lightly as directed for  vulvar irritation. Do not use over 2 weeks without stopping 30 g 0  . furosemide (LASIX) 40 MG tablet Take 40 mg by mouth as needed for fluid.     . hydrochlorothiazide (HYDRODIURIL) 25 MG tablet Take 25 mg by mouth daily.    . hydrocortisone (ANUSOL-HC) 2.5 % rectal cream Place 1 application rectally 2 (two) times daily as needed for hemorrhoids or itching.    . Multiple Vitamin (MULTIVITAMIN WITH MINERALS) TABS tablet Take 1 tablet by mouth daily.    . pantoprazole (PROTONIX) 40 MG tablet Take 1 tablet (40 mg total) by mouth daily. Take 30 minutes before breakfast 90 tablet 3  . ramipril (ALTACE) 10 MG capsule Take 10 mg by mouth daily.    . simvastatin (ZOCOR) 40 MG tablet Take 40 mg by mouth daily.    Marland Kitchen topiramate (TOPAMAX) 25 MG tablet Take 1 tablet (25 mg total) by mouth at bedtime. 90 tablet 3   No current facility-administered medications for this visit.    Allergies as of 12/03/2014 - Review Complete 12/03/2014  Allergen Reaction Noted  . Codeine Other (See Comments) 08/20/2013    Family History  Problem Relation Age of Onset  . Hypertension Mother   . Hypertension Father   . Cancer - Colon Mother   . Colon cancer Father     History   Social History  . Marital Status: Married    Spouse Name: N/A  . Number of Children: 3  . Years of  Education: hs   Occupational History  . Retired    Social History Main Topics  . Smoking status: Never Smoker   . Smokeless tobacco: Never Used  . Alcohol Use: No  . Drug Use: No  . Sexual Activity: Yes    Birth Control/ Protection: Surgical   Other Topics Concern  . None   Social History Narrative   Patient is married with 3 children   Patient is right handed   Patient has a high school education   Patient does not drink any caffeine, occ chocolate candy    Review of Systems: As mentioned in HPI  Physical Exam: BP 133/77 mmHg  Pulse 54  Temp(Src) 97.3 F (36.3 C)  Ht 5\' 2"  (1.575 m)  Wt 214 lb 9.6 oz (97.342 kg)   BMI 39.24 kg/m2 General:   Alert and oriented. No distress noted. Pleasant and cooperative.  Head:  Normocephalic and atraumatic. Eyes:  Conjuctiva clear without scleral icterus. Abdomen:  +BS, soft, non-tender and non-distended. No rebound or guarding. No HSM or masses noted. Msk:  Symmetrical without gross deformities. Normal posture. Extremities:  Without edema. Neurologic:  Alert and  oriented x4;  grossly normal neurologically. Psych:  Alert and cooperative. Normal mood and affect.

## 2014-12-04 NOTE — Assessment & Plan Note (Signed)
Noted on EGD. Continue Protonix once daily. Refills provided. As patient is doing well, return prn.

## 2014-12-04 NOTE — Telephone Encounter (Signed)
Please nic patient for 5 year surveillance colonoscopy.

## 2014-12-04 NOTE — Progress Notes (Signed)
cc'ed to pcp °

## 2014-12-04 NOTE — Assessment & Plan Note (Signed)
Surveillance in 5 years. Personal history of adenomas.

## 2014-12-05 NOTE — Telephone Encounter (Signed)
SHE IS ON RECALL FOR 5 YR TCS

## 2015-01-25 DIAGNOSIS — M25511 Pain in right shoulder: Secondary | ICD-10-CM | POA: Diagnosis not present

## 2015-02-28 DIAGNOSIS — Z23 Encounter for immunization: Secondary | ICD-10-CM | POA: Diagnosis not present

## 2015-03-21 DIAGNOSIS — H401133 Primary open-angle glaucoma, bilateral, severe stage: Secondary | ICD-10-CM | POA: Diagnosis not present

## 2015-03-25 DIAGNOSIS — R8290 Unspecified abnormal findings in urine: Secondary | ICD-10-CM | POA: Diagnosis not present

## 2015-03-25 DIAGNOSIS — H9201 Otalgia, right ear: Secondary | ICD-10-CM | POA: Diagnosis not present

## 2015-03-25 DIAGNOSIS — M545 Low back pain: Secondary | ICD-10-CM | POA: Diagnosis not present

## 2015-03-29 DIAGNOSIS — J019 Acute sinusitis, unspecified: Secondary | ICD-10-CM | POA: Diagnosis not present

## 2015-03-29 DIAGNOSIS — R05 Cough: Secondary | ICD-10-CM | POA: Diagnosis not present

## 2015-05-01 DIAGNOSIS — M25511 Pain in right shoulder: Secondary | ICD-10-CM | POA: Diagnosis not present

## 2015-06-05 ENCOUNTER — Ambulatory Visit (INDEPENDENT_AMBULATORY_CARE_PROVIDER_SITE_OTHER): Payer: Medicare Other | Admitting: Adult Health

## 2015-06-05 ENCOUNTER — Encounter: Payer: Self-pay | Admitting: Adult Health

## 2015-06-05 VITALS — BP 148/79 | HR 56 | Ht 62.0 in | Wt 221.0 lb

## 2015-06-05 DIAGNOSIS — R51 Headache: Secondary | ICD-10-CM

## 2015-06-05 DIAGNOSIS — R519 Headache, unspecified: Secondary | ICD-10-CM

## 2015-06-05 MED ORDER — TOPIRAMATE 25 MG PO TABS: 25.0000 mg | ORAL_TABLET | Freq: Every day | ORAL | Status: DC

## 2015-06-05 NOTE — Progress Notes (Signed)
PATIENT: Kimberly Miranda DOB: 1940/09/20  REASON FOR VISIT: follow up-Headaches HISTORY FROM: patient  HISTORY OF PRESENT ILLNESS: Kimberly Miranda is a 75 year old female with a history of headaches. She returns today for follow-up. The patient continues to take Topamax 25 mg daily. The patient states that her headaches have completely resolved. She is not had any headaches since the last visit. She reports that she has weaned off several other medications as well. Patient denies any new neurological symptoms. She returns today for an evaluation.  HISTORY 06/06/14:Kimberly Miranda is a 75 year old female with a history of headaches. She returns today for follow-up. She is currently taking Topamax 25 mg daily. Patient states that her headaches have resolved with Topamax. She states that she has not had a headache since the last visit. She reports that she does have a cold right now that she is battling. She also had her esophagus stretched- they did remove some polyps. Otherwise she is doing well.   HISTORY 12/04/13: Kimberly Miranda is a 75 year old female with a history of headaches. She returns today for follow-up. She is currently taking Topamax 25mg  daily. Patient states that her headaches have improved. She reports that she has had no headaches since starting Topamax. She states that she takes usually only 1 pill a day but if she remembers she will take two. She states that when she increased to 4 tablets a daily so had nausea and vomiting. No new medical issues since last seen. She does have some shoulder pain but has been prescribed some cream that is working well for her.   REVIEW OF SYSTEMS: Out of a complete 14 system review of symptoms, the patient complains only of the following symptoms, and all other reviewed systems are negative. See history of present illness  ALLERGIES: Allergies  Allergen Reactions  . Codeine Other (See Comments)    Stomach upset    HOME MEDICATIONS: Outpatient  Prescriptions Prior to Visit  Medication Sig Dispense Refill  . aspirin EC 81 MG tablet Take 81 mg by mouth daily.    . bimatoprost (LUMIGAN) 0.03 % ophthalmic solution Place 1 drop into both eyes at bedtime.    . clobetasol cream (TEMOVATE) 0.05 % Use lightly as directed for vulvar irritation. Do not use over 2 weeks without stopping 30 g 0  . furosemide (LASIX) 40 MG tablet Take 40 mg by mouth as needed for fluid.     . hydrochlorothiazide (HYDRODIURIL) 25 MG tablet Take 25 mg by mouth daily.    . hydrocortisone (ANUSOL-HC) 2.5 % rectal cream Place 1 application rectally 2 (two) times daily as needed for hemorrhoids or itching.    . Multiple Vitamin (MULTIVITAMIN WITH MINERALS) TABS tablet Take 1 tablet by mouth daily.    . ramipril (ALTACE) 10 MG capsule Take 10 mg by mouth daily.    . simvastatin (ZOCOR) 40 MG tablet Take 40 mg by mouth daily.    Marland Kitchen topiramate (TOPAMAX) 25 MG tablet Take 1 tablet (25 mg total) by mouth at bedtime. 90 tablet 3  . pantoprazole (PROTONIX) 40 MG tablet Take 1 tablet (40 mg total) by mouth daily. Take 30 minutes before breakfast 90 tablet 3   No facility-administered medications prior to visit.    PAST MEDICAL HISTORY: Past Medical History  Diagnosis Date  . High cholesterol   . Hypertension   . Vitiligo   . Nephrolithiasis   . Other specified cardiac dysrhythmias(427.89)   . Edema   .  GERD (gastroesophageal reflux disease)   . Diabetes ([place])   . Migraine without aura, with intractable migraine, so stated, without mention of status migrainosus 08/31/2013    PAST SURGICAL HISTORY: Past Surgical History  Procedure Laterality Date  . Tonsillectomy    . Back surgery    . Nasal sinus surgery    . Abdominal hysterectomy    . Colonoscopy  2011    Dr. Arnoldo Morale: sessile polyp transverse colon (tubular adenoma)   . Colonoscopy N/A 05/30/2014    Dr. Rourk:multiple colonic polyps/colonic diverticulosis/internal hemorrhoids. Tubular adenomas. Surveillance in 5  years if health permits.   . Esophagogastroduodenoscopy N/A 05/30/2014    Dr. Rourk:schatzki's ring with superimposed erosive reflux esophagitis, s/p dilation. hiatal hernia  . Maloney dilation N/A 05/30/2014    Procedure: Venia Minks DILATION;  Surgeon: Daneil Dolin, MD;  Location: [facility];  Service: Endoscopy;  Laterality: N/A;    FAMILY HISTORY: Family History  Problem Relation Age of Onset  . Hypertension Mother   . Hypertension Father   . Cancer - Colon Mother   . Colon cancer Father     SOCIAL HISTORY: Social History   Social History  . Marital Status: Married    Spouse Name: N/A  . Number of Children: 3  . Years of Education: hs   Occupational History  . Retired    Social History Main Topics  . Smoking status: Never Smoker   . Smokeless tobacco: Never Used  . Alcohol Use: No  . Drug Use: No  . Sexual Activity: Yes    Birth Control/ Protection: Surgical   Other Topics Concern  . Not on file   Social History Narrative   Patient is married with 3 children   Patient is right handed   Patient has a high school education   Patient does not drink any caffeine, occ chocolate candy      PHYSICAL EXAM  Filed Vitals:   06/05/15 1131  BP: 148/79  Pulse: 56  Height: 5\' 2"  (1.575 m)  Weight: 221 lb (100.245 kg)   Body mass index is 40.41 kg/(m^2).  Generalized: Well developed, in no acute distress   Neurological examination  Mentation: Alert oriented to time, place, history taking. Follows all commands speech and language fluent Cranial nerve II-XII: Pupils were equal round reactive to light. Extraocular movements were full, visual field were full on confrontational test. Facial sensation and strength were normal. Uvula tongue midline. Head turning and shoulder shrug  were normal and symmetric. Motor: The motor testing reveals 5 over 5 strength of all 4 extremities. Good symmetric motor tone is noted throughout.  Sensory: Sensory testing is intact to soft  touch on all 4 extremities. No evidence of extinction is noted.  Coordination: Cerebellar testing reveals good finger-nose-finger and heel-to-shin bilaterally.  Gait and station: Gait is normal. Tandem gait is normal. Romberg is negative. No drift is seen.  Reflexes: Deep tendon reflexes are symmetric and normal bilaterally.   DIAGNOSTIC DATA (LABS, IMAGING, TESTING) - I reviewed patient records, labs, notes, testing and imaging myself where available.  Lab Results  Component Value Date   WBC 9.3 08/24/2013   HGB 17.4* 08/24/2013   HCT 50.4* 08/24/2013   MCV 87.7 08/24/2013   PLT 306 08/24/2013      Component Value Date/Time   NA 140 08/24/2013 1906   K 4.0 08/24/2013 1906   CL 98 08/24/2013 1906   CO2 32 08/24/2013 1906   GLUCOSE 183* 08/24/2013 1906   BUN  22 08/24/2013 1906   CREATININE 0.85 08/24/2013 1906   CALCIUM 9.9 08/24/2013 1906   PROT 7.3 08/24/2013 1906   ALBUMIN 4.0 08/24/2013 1906   AST 21 08/24/2013 1906   ALT 19 08/24/2013 1906   ALKPHOS 82 08/24/2013 1906   BILITOT 0.6 08/24/2013 1906   GFRNONAA 65* 08/24/2013 1906   GFRAA 67* 08/24/2013 1906      ASSESSMENT AND PLAN 75 y.o. year old female  has a past medical history of High cholesterol; Hypertension; Vitiligo; Nephrolithiasis; Other specified cardiac dysrhythmias(427.89); Edema; GERD (gastroesophageal reflux disease); Diabetes ([place]); and Migraine without aura, with intractable migraine, so stated, without mention of status migrainosus (08/31/2013). here with:  1. Headache  The patient reports that she has not had any headaches since starting Topamax. I advised the patient that she may try weaning off of Topamax and see if her headaches return. Patient is amenable to this. I discussed with the patient how to wean off of Topamax. Patient advised that if her headaches return she can simply restart the Topamax. She will follow-up in 3-4 months or sooner if needed.  Ward Givens, MSN, NP-C 06/05/2015, 4:02  PM [facility] [address], [place], [place] [address] [phone]

## 2015-06-05 NOTE — Progress Notes (Signed)
I have read the note, and I agree with the clinical assessment and plan.  WILLIS,CHARLES KEITH   

## 2015-06-05 NOTE — Patient Instructions (Signed)
You may try to wean off Topamax. If headaches return you can restart the Topamax If your symptoms worsen or you develop new symptoms please let us know.

## 2015-06-07 ENCOUNTER — Ambulatory Visit: Payer: Medicare Other | Admitting: Adult Health

## 2015-07-26 DIAGNOSIS — H2513 Age-related nuclear cataract, bilateral: Secondary | ICD-10-CM | POA: Diagnosis not present

## 2015-08-01 DIAGNOSIS — M25511 Pain in right shoulder: Secondary | ICD-10-CM | POA: Diagnosis not present

## 2015-08-12 ENCOUNTER — Other Ambulatory Visit: Payer: Self-pay | Admitting: Obstetrics and Gynecology

## 2015-08-13 NOTE — Telephone Encounter (Signed)
refil clobetasol given x 30 gm tube

## 2015-08-27 DIAGNOSIS — H2513 Age-related nuclear cataract, bilateral: Secondary | ICD-10-CM | POA: Diagnosis not present

## 2015-09-03 ENCOUNTER — Encounter: Payer: Self-pay | Admitting: Adult Health

## 2015-09-03 ENCOUNTER — Ambulatory Visit (INDEPENDENT_AMBULATORY_CARE_PROVIDER_SITE_OTHER): Payer: Medicare Other | Admitting: Adult Health

## 2015-09-03 VITALS — BP 168/76 | HR 67 | Ht 62.0 in | Wt 227.2 lb

## 2015-09-03 DIAGNOSIS — R51 Headache: Secondary | ICD-10-CM | POA: Diagnosis not present

## 2015-09-03 DIAGNOSIS — R519 Headache, unspecified: Secondary | ICD-10-CM

## 2015-09-03 NOTE — Patient Instructions (Signed)
If headaches return let us know

## 2015-09-03 NOTE — Progress Notes (Signed)
I have read the note, and I agree with the clinical assessment and plan.  Yaniris Braddock KEITH   

## 2015-09-03 NOTE — Progress Notes (Signed)
PATIENT: Kimberly Miranda DOB: 1940/08/31  REASON FOR VISIT: follow up- headache HISTORY FROM: patient  HISTORY OF PRESENT ILLNESS: Ms. Kimberly Miranda is a 75 year old female with a history of headaches. She returns today for follow-up. The patient states that she did wean off the Topamax. She has not had any subsequent headaches. She states that she has been doing very well. She denies any new neurological symptoms. She returns today for an evaluation.  HISTORY 06/05/15: Kimberly Miranda is a 75 year old female with a history of headaches. She returns today for follow-up. The patient continues to take Topamax 25 mg daily. The patient states that her headaches have completely resolved. She is not had any headaches since the last visit. She reports that she has weaned off several other medications as well. Patient denies any new neurological symptoms. She returns today for an evaluation.  HISTORY 06/06/14:Kimberly Miranda is a 75 year old female with a history of headaches. She returns today for follow-up. She is currently taking Topamax 25 mg daily. Patient states that her headaches have resolved with Topamax. She states that she has not had a headache since the last visit. She reports that she does have a cold right now that she is battling. She also had her esophagus stretched- they did remove some polyps. Otherwise she is doing well.   HISTORY 12/04/13: Kimberly Miranda is a 75 year old female with a history of headaches. She returns today for follow-up. She is currently taking Topamax 25mg  daily. Patient states that her headaches have improved. She reports that she has had no headaches since starting Topamax. She states that she takes usually only 1 pill a day but if she remembers she will take two. She states that when she increased to 4 tablets a daily so had nausea and vomiting. No new medical issues since last seen. She does have some shoulder pain but has been prescribed some cream that is working well for her.   REVIEW  OF SYSTEMS: Out of a complete 14 system review of symptoms, the patient complains only of the following symptoms, and all other reviewed systems are negative.  See history of present illness  ALLERGIES: Allergies  Allergen Reactions  . Codeine Other (See Comments)    Stomach upset    HOME MEDICATIONS: Outpatient Prescriptions Prior to Visit  Medication Sig Dispense Refill  . aspirin EC 81 MG tablet Take 81 mg by mouth daily.    . bimatoprost (LUMIGAN) 0.03 % ophthalmic solution Place 1 drop into both eyes at bedtime.    . clobetasol cream (TEMOVATE) 0.05 % Use lightly as directed for vulvar irritation. Do not use over 2 weeks without stopping 30 g 0  . furosemide (LASIX) 40 MG tablet Take 40 mg by mouth as needed for fluid.     . hydrochlorothiazide (HYDRODIURIL) 25 MG tablet Take 25 mg by mouth daily.    . hydrocortisone (ANUSOL-HC) 2.5 % rectal cream Place 1 application rectally 2 (two) times daily as needed for hemorrhoids or itching.    . Multiple Vitamin (MULTIVITAMIN WITH MINERALS) TABS tablet Take 1 tablet by mouth daily.    . ramipril (ALTACE) 10 MG capsule Take 10 mg by mouth daily.    . simvastatin (ZOCOR) 40 MG tablet Take 20 mg by mouth daily.     Marland Kitchen topiramate (TOPAMAX) 25 MG tablet Take 1 tablet (25 mg total) by mouth at bedtime. (Patient not taking: Reported on 09/03/2015) 90 tablet 3   No facility-administered medications prior to  visit.    PAST MEDICAL HISTORY: Past Medical History  Diagnosis Date  . High cholesterol   . Hypertension   . Vitiligo   . Nephrolithiasis   . Other specified cardiac dysrhythmias(427.89)   . Edema   . GERD (gastroesophageal reflux disease)   . Diabetes ([place])   . Migraine without aura, with intractable migraine, so stated, without mention of status migrainosus 08/31/2013    PAST SURGICAL HISTORY: Past Surgical History  Procedure Laterality Date  . Tonsillectomy    . Back surgery    . Nasal sinus surgery    . Abdominal hysterectomy     . Colonoscopy  2011    Dr. Arnoldo Morale: sessile polyp transverse colon (tubular adenoma)   . Colonoscopy N/A 05/30/2014    Dr. Rourk:multiple colonic polyps/colonic diverticulosis/internal hemorrhoids. Tubular adenomas. Surveillance in 5 years if health permits.   . Esophagogastroduodenoscopy N/A 05/30/2014    Dr. Rourk:schatzki's ring with superimposed erosive reflux esophagitis, s/p dilation. hiatal hernia  . Maloney dilation N/A 05/30/2014    Procedure: Venia Minks DILATION;  Surgeon: Daneil Dolin, MD;  Location: [facility];  Service: Endoscopy;  Laterality: N/A;    FAMILY HISTORY: Family History  Problem Relation Age of Onset  . Hypertension Mother   . Hypertension Father   . Cancer - Colon Mother   . Colon cancer Father     SOCIAL HISTORY: Social History   Social History  . Marital Status: Married    Spouse Name: N/A  . Number of Children: 3  . Years of Education: hs   Occupational History  . Retired    Social History Main Topics  . Smoking status: Never Smoker   . Smokeless tobacco: Never Used  . Alcohol Use: No  . Drug Use: No  . Sexual Activity: Yes    Birth Control/ Protection: Surgical   Other Topics Concern  . Not on file   Social History Narrative   Patient is married with 3 children   Patient is right handed   Patient has a high school education   Patient does not drink any caffeine, occ chocolate candy      PHYSICAL EXAM  Filed Vitals:   09/03/15 1124  BP: 168/76  Pulse: 67  Height: 5\' 2"  (1.575 m)  Weight: 227 lb 3.2 oz (103.057 kg)   Body mass index is 41.54 kg/(m^2).  Generalized: Well developed, in no acute distress   Neurological examination  Mentation: Alert oriented to time, place, history taking. Follows all commands speech and language fluent Cranial nerve II-XII: Pupils were equal round reactive to light. Extraocular movements were full, visual field were full on confrontational test. Facial sensation and strength were normal.  Uvula tongue midline. Head turning and shoulder shrug  were normal and symmetric. Motor: The motor testing reveals 5 over 5 strength of all 4 extremities. Good symmetric motor tone is noted throughout.  Sensory: Sensory testing is intact to soft touch on all 4 extremities. No evidence of extinction is noted.  Coordination: Cerebellar testing reveals good finger-nose-finger and heel-to-shin bilaterally.  Gait and station: Gait is normal. Tandem gait Not attempted. Romberg is negative. No drift is seen.  Reflexes: Deep tendon reflexes are symmetric and normal bilaterally.   DIAGNOSTIC DATA (LABS, IMAGING, TESTING) - I reviewed patient records, labs, notes, testing and imaging myself where available.     ASSESSMENT AND PLAN 76 y.o. year old female  has a past medical history of High cholesterol; Hypertension; Vitiligo; Nephrolithiasis; Other specified cardiac dysrhythmias(427.89);  Edema; GERD (gastroesophageal reflux disease); Diabetes ([place]); and Migraine without aura, with intractable migraine, so stated, without mention of status migrainosus (08/31/2013). here with:  1. Headache  Overall the patient is doing well. She is no longer topamax. She has not had any additional headaches. Patient advised that if her symptoms return she should let us know. Otherwise she will follow-up on an as-needed basis.     Ward Givens, MSN, NP-C 09/03/2015, 11:52 AM [facility] [address], [place], [place] [address] [phone]

## 2015-10-15 DIAGNOSIS — H401113 Primary open-angle glaucoma, right eye, severe stage: Secondary | ICD-10-CM | POA: Diagnosis not present

## 2015-10-15 DIAGNOSIS — H2511 Age-related nuclear cataract, right eye: Secondary | ICD-10-CM | POA: Diagnosis not present

## 2015-10-16 DIAGNOSIS — H401133 Primary open-angle glaucoma, bilateral, severe stage: Secondary | ICD-10-CM | POA: Diagnosis not present

## 2015-10-16 DIAGNOSIS — H2512 Age-related nuclear cataract, left eye: Secondary | ICD-10-CM | POA: Diagnosis not present

## 2015-10-29 DIAGNOSIS — H2512 Age-related nuclear cataract, left eye: Secondary | ICD-10-CM | POA: Diagnosis not present

## 2015-10-29 DIAGNOSIS — H269 Unspecified cataract: Secondary | ICD-10-CM | POA: Diagnosis not present

## 2015-10-29 DIAGNOSIS — H401123 Primary open-angle glaucoma, left eye, severe stage: Secondary | ICD-10-CM | POA: Diagnosis not present

## 2015-11-01 DIAGNOSIS — I10 Essential (primary) hypertension: Secondary | ICD-10-CM | POA: Diagnosis not present

## 2015-11-01 DIAGNOSIS — E119 Type 2 diabetes mellitus without complications: Secondary | ICD-10-CM | POA: Diagnosis not present

## 2015-11-01 DIAGNOSIS — Z79899 Other long term (current) drug therapy: Secondary | ICD-10-CM | POA: Diagnosis not present

## 2015-11-01 DIAGNOSIS — K219 Gastro-esophageal reflux disease without esophagitis: Secondary | ICD-10-CM | POA: Diagnosis not present

## 2015-11-01 DIAGNOSIS — E785 Hyperlipidemia, unspecified: Secondary | ICD-10-CM | POA: Diagnosis not present

## 2015-11-08 ENCOUNTER — Other Ambulatory Visit (HOSPITAL_COMMUNITY): Payer: Self-pay | Admitting: Internal Medicine

## 2015-11-08 DIAGNOSIS — I10 Essential (primary) hypertension: Secondary | ICD-10-CM | POA: Diagnosis not present

## 2015-11-08 DIAGNOSIS — Z6841 Body Mass Index (BMI) 40.0 and over, adult: Secondary | ICD-10-CM | POA: Diagnosis not present

## 2015-11-08 DIAGNOSIS — E1129 Type 2 diabetes mellitus with other diabetic kidney complication: Secondary | ICD-10-CM | POA: Diagnosis not present

## 2015-11-08 DIAGNOSIS — Z1231 Encounter for screening mammogram for malignant neoplasm of breast: Secondary | ICD-10-CM

## 2015-11-13 ENCOUNTER — Ambulatory Visit (HOSPITAL_COMMUNITY)
Admission: RE | Admit: 2015-11-13 | Discharge: 2015-11-13 | Disposition: A | Discharge status: Home / Self Care | Payer: Medicare Other | Source: Ambulatory Visit | Attending: Internal Medicine | Admitting: Internal Medicine

## 2015-11-13 DIAGNOSIS — Z1231 Encounter for screening mammogram for malignant neoplasm of breast: Secondary | ICD-10-CM | POA: Diagnosis not present

## 2015-12-11 DIAGNOSIS — H01111 Allergic dermatitis of right upper eyelid: Secondary | ICD-10-CM | POA: Diagnosis not present

## 2015-12-11 DIAGNOSIS — H401133 Primary open-angle glaucoma, bilateral, severe stage: Secondary | ICD-10-CM | POA: Diagnosis not present

## 2015-12-11 DIAGNOSIS — H01114 Allergic dermatitis of left upper eyelid: Secondary | ICD-10-CM | POA: Diagnosis not present

## 2016-01-01 DIAGNOSIS — M1711 Unilateral primary osteoarthritis, right knee: Secondary | ICD-10-CM | POA: Diagnosis not present

## 2016-01-30 ENCOUNTER — Other Ambulatory Visit: Payer: Self-pay | Admitting: Obstetrics and Gynecology

## 2016-02-05 DIAGNOSIS — Z23 Encounter for immunization: Secondary | ICD-10-CM | POA: Diagnosis not present

## 2016-03-10 DIAGNOSIS — H903 Sensorineural hearing loss, bilateral: Secondary | ICD-10-CM | POA: Diagnosis not present

## 2016-04-15 DIAGNOSIS — H401131 Primary open-angle glaucoma, bilateral, mild stage: Secondary | ICD-10-CM | POA: Diagnosis not present

## 2016-04-30 DIAGNOSIS — J209 Acute bronchitis, unspecified: Secondary | ICD-10-CM | POA: Diagnosis not present

## 2016-04-30 DIAGNOSIS — J019 Acute sinusitis, unspecified: Secondary | ICD-10-CM | POA: Diagnosis not present

## 2016-05-05 DIAGNOSIS — E119 Type 2 diabetes mellitus without complications: Secondary | ICD-10-CM | POA: Diagnosis not present

## 2016-05-11 DIAGNOSIS — I10 Essential (primary) hypertension: Secondary | ICD-10-CM | POA: Diagnosis not present

## 2016-05-11 DIAGNOSIS — E1129 Type 2 diabetes mellitus with other diabetic kidney complication: Secondary | ICD-10-CM | POA: Diagnosis not present

## 2016-05-11 DIAGNOSIS — Z6841 Body Mass Index (BMI) 40.0 and over, adult: Secondary | ICD-10-CM | POA: Diagnosis not present

## 2016-05-26 DIAGNOSIS — R05 Cough: Secondary | ICD-10-CM | POA: Diagnosis not present

## 2016-07-15 DIAGNOSIS — N390 Urinary tract infection, site not specified: Secondary | ICD-10-CM | POA: Diagnosis not present

## 2016-07-28 DIAGNOSIS — M170 Bilateral primary osteoarthritis of knee: Secondary | ICD-10-CM | POA: Diagnosis not present

## 2016-09-21 ENCOUNTER — Other Ambulatory Visit: Payer: Self-pay | Admitting: Obstetrics and Gynecology

## 2016-10-21 DIAGNOSIS — H401131 Primary open-angle glaucoma, bilateral, mild stage: Secondary | ICD-10-CM | POA: Diagnosis not present

## 2016-10-28 DIAGNOSIS — M170 Bilateral primary osteoarthritis of knee: Secondary | ICD-10-CM | POA: Diagnosis not present

## 2016-11-13 DIAGNOSIS — E1129 Type 2 diabetes mellitus with other diabetic kidney complication: Secondary | ICD-10-CM | POA: Diagnosis not present

## 2016-11-13 DIAGNOSIS — I10 Essential (primary) hypertension: Secondary | ICD-10-CM | POA: Diagnosis not present

## 2016-11-13 DIAGNOSIS — Z79899 Other long term (current) drug therapy: Secondary | ICD-10-CM | POA: Diagnosis not present

## 2016-11-20 DIAGNOSIS — I10 Essential (primary) hypertension: Secondary | ICD-10-CM | POA: Diagnosis not present

## 2016-11-20 DIAGNOSIS — E1129 Type 2 diabetes mellitus with other diabetic kidney complication: Secondary | ICD-10-CM | POA: Diagnosis not present

## 2016-11-20 DIAGNOSIS — I491 Atrial premature depolarization: Secondary | ICD-10-CM | POA: Diagnosis not present

## 2017-01-07 ENCOUNTER — Emergency Department (HOSPITAL_COMMUNITY): Payer: Medicare Other

## 2017-01-07 ENCOUNTER — Other Ambulatory Visit: Payer: Self-pay

## 2017-01-07 ENCOUNTER — Encounter (HOSPITAL_COMMUNITY): Payer: Self-pay | Admitting: Emergency Medicine

## 2017-01-07 ENCOUNTER — Observation Stay (HOSPITAL_COMMUNITY)
Admission: EM | Admit: 2017-01-07 | Discharge: 2017-01-08 | Disposition: A | Discharge status: Home / Self Care | Payer: Medicare Other | Attending: Internal Medicine | Admitting: Internal Medicine

## 2017-01-07 DIAGNOSIS — R0789 Other chest pain: Secondary | ICD-10-CM | POA: Diagnosis not present

## 2017-01-07 DIAGNOSIS — E7800 Pure hypercholesterolemia, unspecified: Secondary | ICD-10-CM | POA: Diagnosis not present

## 2017-01-07 DIAGNOSIS — I200 Unstable angina: Secondary | ICD-10-CM

## 2017-01-07 DIAGNOSIS — E785 Hyperlipidemia, unspecified: Secondary | ICD-10-CM | POA: Diagnosis present

## 2017-01-07 DIAGNOSIS — E119 Type 2 diabetes mellitus without complications: Secondary | ICD-10-CM | POA: Diagnosis not present

## 2017-01-07 DIAGNOSIS — I10 Essential (primary) hypertension: Secondary | ICD-10-CM | POA: Diagnosis not present

## 2017-01-07 DIAGNOSIS — E669 Obesity, unspecified: Secondary | ICD-10-CM | POA: Diagnosis not present

## 2017-01-07 DIAGNOSIS — R079 Chest pain, unspecified: Secondary | ICD-10-CM | POA: Diagnosis not present

## 2017-01-07 DIAGNOSIS — E1169 Type 2 diabetes mellitus with other specified complication: Secondary | ICD-10-CM | POA: Diagnosis present

## 2017-01-07 DIAGNOSIS — Z7982 Long term (current) use of aspirin: Secondary | ICD-10-CM | POA: Diagnosis not present

## 2017-01-07 LAB — HEPATIC FUNCTION PANEL
ALT: 22 U/L (ref 14–54)
AST: 23 U/L (ref 15–41)
Albumin: 3.8 g/dL (ref 3.5–5.0)
Alkaline Phosphatase: 61 U/L (ref 38–126)
BILIRUBIN INDIRECT: 0.9 mg/dL (ref 0.3–0.9)
Bilirubin, Direct: 0.1 mg/dL (ref 0.1–0.5)
TOTAL PROTEIN: 6.6 g/dL (ref 6.5–8.1)
Total Bilirubin: 1 mg/dL (ref 0.3–1.2)

## 2017-01-07 LAB — DIFFERENTIAL
BASOS ABS: 0 10*3/uL (ref 0.0–0.1)
BASOS PCT: 1 %
Eosinophils Absolute: 0.4 10*3/uL (ref 0.0–0.7)
Eosinophils Relative: 5 %
LYMPHS PCT: 31 %
Lymphs Abs: 2.4 10*3/uL (ref 0.7–4.0)
MONOS PCT: 6 %
Monocytes Absolute: 0.5 10*3/uL (ref 0.1–1.0)
NEUTROS ABS: 4.6 10*3/uL (ref 1.7–7.7)
Neutrophils Relative %: 57 %

## 2017-01-07 LAB — TROPONIN I: Troponin I: <0.03 ng/mL (ref <0.03)

## 2017-01-07 LAB — CBC
HCT: 46.8 % — ABNORMAL HIGH (ref 36.0–46.0)
HEMOGLOBIN: 16 g/dL — AB (ref 12.0–15.0)
MCH: 31.1 pg (ref 26.0–34.0)
MCHC: 34.2 g/dL (ref 30.0–36.0)
MCV: 91.1 fL (ref 78.0–100.0)
Platelets: 247 10*3/uL (ref 150–400)
RBC: 5.14 MIL/uL — ABNORMAL HIGH (ref 3.87–5.11)
RDW: 13.9 % (ref 11.5–15.5)
WBC: 8 10*3/uL (ref 4.0–10.5)

## 2017-01-07 LAB — GLUCOSE, CAPILLARY: GLUCOSE-CAPILLARY: 194 mg/dL — AB (ref 65–99)

## 2017-01-07 LAB — BASIC METABOLIC PANEL
Anion gap: 9 (ref 5–15)
BUN: 18 mg/dL (ref 6–20)
CHLORIDE: 102 mmol/L (ref 101–111)
CO2: 29 mmol/L (ref 22–32)
CREATININE: 0.79 mg/dL (ref 0.44–1.00)
Calcium: 9.1 mg/dL (ref 8.9–10.3)
GFR calc Af Amer: >60 mL/min (ref >60)
GFR calc non Af Amer: >60 mL/min (ref >60)
Glucose, Bld: 174 mg/dL — ABNORMAL HIGH (ref 65–99)
Potassium: 3.6 mmol/L (ref 3.5–5.1)
Sodium: 140 mmol/L (ref 135–145)

## 2017-01-07 LAB — TSH: TSH: 1.34 u[IU]/mL (ref 0.350–4.500)

## 2017-01-07 MED ORDER — HYDRALAZINE HCL 20 MG/ML IJ SOLN: 5.0000 mg | INTRAMUSCULAR | Status: DC | PRN

## 2017-01-07 MED ORDER — ONDANSETRON HCL 4 MG/2ML IJ SOLN: 4.0000 mg | Freq: Four times a day (QID) | INTRAMUSCULAR | Status: DC | PRN

## 2017-01-07 MED ORDER — HYDROCHLOROTHIAZIDE 25 MG PO TABS
25.0000 mg | ORAL_TABLET | Freq: Every day | ORAL | Status: DC
  Administered 2017-01-08: 25 mg via ORAL
  Filled 2017-01-07: qty 1

## 2017-01-07 MED ORDER — GI COCKTAIL ~~LOC~~: 30.0000 mL | Freq: Four times a day (QID) | ORAL | Status: DC | PRN

## 2017-01-07 MED ORDER — ENOXAPARIN SODIUM 40 MG/0.4ML ~~LOC~~ SOLN
40.0000 mg | SUBCUTANEOUS | Status: DC
  Administered 2017-01-07: 40 mg via SUBCUTANEOUS
  Filled 2017-01-07: qty 0.4

## 2017-01-07 MED ORDER — ACETAMINOPHEN 325 MG PO TABS: 650.0000 mg | ORAL_TABLET | ORAL | Status: DC | PRN

## 2017-01-07 MED ORDER — MORPHINE SULFATE (PF) 2 MG/ML IV SOLN
2.0000 mg | INTRAVENOUS | Status: DC | PRN
  Administered 2017-01-08: 2 mg via INTRAVENOUS
  Filled 2017-01-07: qty 1

## 2017-01-07 MED ORDER — ASPIRIN EC 81 MG PO TBEC
81.0000 mg | DELAYED_RELEASE_TABLET | Freq: Every day | ORAL | Status: DC
  Administered 2017-01-08: 81 mg via ORAL
  Filled 2017-01-07: qty 1

## 2017-01-07 MED ORDER — INSULIN ASPART 100 UNIT/ML ~~LOC~~ SOLN
0.0000 [IU] | Freq: Three times a day (TID) | SUBCUTANEOUS | Status: DC
  Administered 2017-01-08: 2 [IU] via SUBCUTANEOUS

## 2017-01-07 MED ORDER — RAMIPRIL 5 MG PO CAPS
10.0000 mg | ORAL_CAPSULE | Freq: Every day | ORAL | Status: DC
  Administered 2017-01-07 – 2017-01-08 (×2): 10 mg via ORAL
  Filled 2017-01-07 (×2): qty 2

## 2017-01-07 MED ORDER — SIMVASTATIN 10 MG PO TABS
10.0000 mg | ORAL_TABLET | Freq: Every day | ORAL | Status: DC
  Administered 2017-01-07: 10 mg via ORAL
  Filled 2017-01-07: qty 1

## 2017-01-07 MED ORDER — INSULIN ASPART 100 UNIT/ML ~~LOC~~ SOLN: 0.0000 [IU] | Freq: Every day | SUBCUTANEOUS | Status: DC

## 2017-01-07 MED ORDER — NITROGLYCERIN 0.4 MG SL SUBL
0.4000 mg | SUBLINGUAL_TABLET | Freq: Once | SUBLINGUAL | Status: AC
  Administered 2017-01-07: 0.4 mg via SUBLINGUAL
  Filled 2017-01-07: qty 1

## 2017-01-07 NOTE — ED Triage Notes (Addendum)
Pt reports chest tightness intermittent x1 week. Pt reports "my blood pressure has been fluctuating" pt reports contacted pcp and was told to come here. Pt denies any change in pain with movement or deep breath.

## 2017-01-07 NOTE — H&P (Signed)
History and Physical    Kimberly Miranda EQA:[id number] DOB: 1940-08-07 DOA: 01/07/2017  PCP: Asencion Noble, MD Consultants:  eye Patient coming from:  Home - lives alone; NOK: Daughters, [phone]  Chief Complaint: chest pain  HPI: Kimberly Miranda is a 76 y.o. female with medical history significant of HTN, HLD, DM presenting with CP.  She reports that her BP has been fluctuating for the last month.  She started a few weeks ago feeling weak and dizzy.  After a few days that went away and she developed intermittent chest pain.  Now it is all the time.  She gets very shaky.  Substernal chest pain, previously coming and going but it woke her up yesterday AM and has been constant.  She called Dr. Willey Blade this AM and he suggested she come in.  Non-exertional, constant and unchanging pain regardless of what she does.  No alleviating factors.  No change in the ER including with NTG.  +SOB for a few months.  It comes and goes but seems to be separate from the chest pain.  She has gained weight so she is blaming that for the SOB.  She has gained 35 pounds in 3 years, 10 in the last year.  She takes Miralax for constipation.   ED Course: Chest pressure suspicious for CAD.  Cardiology called; Dr. Harl Bowie recommends r/o, NPO after MN, prn morphine and NTG, start Heparin if enzymes positive or EKG change  Review of Systems: As per HPI; otherwise review of systems reviewed and negative.   Ambulatory Status:  Ambulates without assistance  Past Medical History:  Diagnosis Date  . Diabetes ([place])    diet controlled since 2014  . Edema   . GERD (gastroesophageal reflux disease)   . High cholesterol   . Hypertension   . Migraine without aura, with intractable migraine, so stated, without mention of status migrainosus 08/31/2013  . Nephrolithiasis   . Other specified cardiac dysrhythmias(427.89)   . Vitiligo     Past Surgical History:  Procedure Laterality Date  . ABDOMINAL HYSTERECTOMY    . BACK SURGERY    .  COLONOSCOPY  2011   Dr. Arnoldo Morale: sessile polyp transverse colon (tubular adenoma)   . COLONOSCOPY N/A 05/30/2014   Dr. Rourk:multiple colonic polyps/colonic diverticulosis/internal hemorrhoids. Tubular adenomas. Surveillance in 5 years if health permits.   . ESOPHAGOGASTRODUODENOSCOPY N/A 05/30/2014   Dr. Rourk:schatzki's ring with superimposed erosive reflux esophagitis, s/p dilation. hiatal hernia  . MALONEY DILATION N/A 05/30/2014   Procedure: Venia Minks DILATION;  Surgeon: Daneil Dolin, MD;  Location: [facility];  Service: Endoscopy;  Laterality: N/A;  . NASAL SINUS SURGERY    . TONSILLECTOMY      Social History   Social History  . Marital status: Widowed    Spouse name: N/A  . Number of children: 3  . Years of education: hs   Occupational History  . Retired    Social History Main Topics  . Smoking status: Never Smoker  . Smokeless tobacco: Never Used  . Alcohol use 0.0 oz/week     Comment: rare use  . Drug use: No  . Sexual activity: Yes    Birth control/ protection: Surgical   Other Topics Concern  . Not on file   Social History Narrative   Patient is married with 3 children   Patient is right handed   Patient has a high school education   Patient does not drink any caffeine, occ chocolate candy  Allergies  Allergen Reactions  . Codeine Other (See Comments)    Stomach upset    Family History  Problem Relation Age of Onset  . Hypertension Mother   . Cancer - Colon Mother   . Hypertension Father   . Colon cancer Father     Prior to Admission medications   Medication Sig Start Date End Date Taking? Authorizing Provider  ALOE VERA PO Take 1 tablet by mouth daily.   Yes [provider]  aspirin EC 81 MG tablet Take 81 mg by mouth daily.   Yes [provider]  Biotin 10000 MCG TABS Take 1 tablet by mouth daily.   Yes [provider]  hydrochlorothiazide (HYDRODIURIL) 25 MG tablet Take 25 mg by mouth daily. 08/15/13  Yes [provider]  ramipril (ALTACE) 10 MG capsule Take 10 mg by mouth daily. 06/27/13  Yes [provider]  simvastatin (ZOCOR) 10 MG tablet Take 10 mg by mouth daily.    Yes [provider]    Physical Exam: Vitals:   01/07/17 1730 01/07/17 1745 01/07/17 1824 01/07/17 2042  BP: (!) 160/86  (!) 149/64 (!) 155/54  Pulse:  (!) 57 64 62  Resp: 15 (!) 25 20 20   Temp:   (!) 97.5 F (36.4 C) 98 F (36.7 C)  TempSrc:   Oral Oral  SpO2:  96% 98% 97%  Weight:   107.1 kg (236 lb 1.6 oz)   Height:   5\' 2"  (1.575 m)      General:  Appears calm and comfortable and is NAD Eyes:  PERRL, EOMI, normal lids, iris ENT:  grossly normal hearing, lips & tongue, mmm Neck:  no LAD, masses or thyromegaly; no carotid bruits Cardiovascular:  RRR, no m/r/g. No LE edema.  No reproducibility to chest pain. Respiratory:   CTA bilaterally with no wheezes/rales/rhonchi.  Normal respiratory effort. Abdomen:  soft, NT, ND, NABS Back:   normal alignment, no CVAT Skin:  no rash or induration seen on limited exam Musculoskeletal:  grossly normal tone BUE/BLE, good ROM, no bony abnormality Lower extremity:  No LE edema.  Limited foot exam with no ulcerations.  2+ distal pulses. Psychiatric:  grossly normal mood and affect, speech fluent and appropriate, AOx3 Neurologic:  CN 2-12 grossly intact, moves all extremities in coordinated fashion, sensation intact    Radiological Exams on Admission: Dg Chest 2 View  Result Date: 01/07/2017 CLINICAL DATA:  76 year old female with a history of chest tightness for 1 week EXAM: CHEST  2 VIEW COMPARISON:  12/27/2008 FINDINGS: Cardiomediastinal silhouette unchanged in size and contour. No evidence of central vascular congestion. No pneumothorax or pleural effusion. No confluent airspace disease. Coarsened interstitial markings similar to the prior. No displaced fracture.  Degenerative changes of the spine. IMPRESSION: No radiographic evidence of acute  cardiopulmonary disease. Electronically Signed   By: Corrie Mckusick D.O.   On: 01/07/2017 10:52    EKG: Independently reviewed.  NSR with rate 64; nonspecific ST changes with no evidence of acute ischemia   Labs on Admission: I have personally reviewed the available labs and imaging studies at the time of the admission.  Pertinent labs:   Glucose 174, 194 Troponin < 0.03 x 2  Assessment/Plan Principal Problem:   Chest pain Active Problems:   Essential hypertension   Diabetes mellitus type 2 in obese (HCC)   Hyperlipidemia   Chest pain -Patient with substernal chest pressure that has come on intermittently for days to weeks but has been  constant since yesterday AM.  The pain is ongoing, non-exertional, and was unchanged with NTG. -1/3 typical symptoms suggestive of noncardiac chest pain.  -CXR unremarkable.   -Initial cardiac troponin negative.  -EKG not indicative of acute ischemia.   -GRACE score is 106; which predicts an in-hospital death rate of 1%.  -Will plan to place in observation status on telemetry to rule out ACS by overnight observation.  -cycle troponin q6h x 3 and repeat EKG in AM -Continue ASA 81 mg  daily -morphine given -Risk factor stratification with HgbA1c and FLP; will also check TSH  -Cardiology consultation in AM - NPO for possible stress test  -Will plan to start Heparin drip if enzymes are positive and/or EKG/telemetry changes -She does have ongoing chest pain which appears to be non-cardiac in nature so will not start Heparin at this time  HTN -Takes Altace and HCTZ at home -Patient with suboptimal control while in the ER -Cannot add beta blocker due to borderline bradycardia -Will add prn hydralazine  HLD -Continue Zocor -Check lipids  DM -Glucose 174, 194 -Diet controlled -Check A1c -Will cover with moderate-scale SSI for now -She appears likely to need resumption of medical therapy    DVT prophylaxis:  Lovenox Code Status:  Full -  confirmed with patient/family Family Communication: Daughters present throughout evaluation Disposition Plan:  Home once clinically improved Consults called: Cardiology - to see in AM  Admission status: It is my clinical opinion that referral for OBSERVATION is reasonable and necessary in this patient based on the above information provided. The aforementioned taken together are felt to place the patient at high risk for further clinical deterioration. However it is anticipated that the patient may be medically stable for discharge from the hospital within 24 to 48 hours.    Karmen Bongo MD [facility]  If note is complete, please contact covering daytime or nighttime physician. [email or web address] Password [email or web address]  01/07/2017, 8:51 PM

## 2017-01-07 NOTE — ED Notes (Signed)
Spoke with the family and have given update on status

## 2017-01-07 NOTE — Progress Notes (Signed)
Case discussed with ER staff, patient presents with chest pain. She has just started her cardiac workup. Enzymes neg x 1, EKG without acute ischemic changes and possible old anteroseptal infarct. She will need EKG and enzymes cycled overnight, echo, and npo overnight for possible stress. PRN NG and morphine for chest pain, if enzyme or EKG change or ongoing chest pain would start anticoagulation. Further cardiology recs once further workup is complete. Full consult to follow tomorrow AM.    Carlyle Dolly MD

## 2017-01-07 NOTE — ED Provider Notes (Signed)
[facility] DEPT Provider Note   CSN: [id number] Arrival date & time: 01/07/17  1005     History   Chief Complaint Chief Complaint  Patient presents with  . Chest Pain    HPI Kimberly Miranda is a 76 y.o. female.  Patient states that she's been having chest pressure off and on for months that seems to be coming more frequently and then this morning it was severe and would not go away.   The history is provided by the patient.  Chest Pain   This is a new problem. The current episode started 12 to 24 hours ago. The problem occurs constantly. The problem has not changed since onset.The pain is associated with exertion. The pain is present in the substernal region. The pain is at a severity of 3/10. The pain is moderate. Pertinent negatives include no abdominal pain, no back pain, no cough and no headaches.  Pertinent negatives for past medical history include no seizures.    Past Medical History:  Diagnosis Date  . Diabetes ([place])   . Edema   . GERD (gastroesophageal reflux disease)   . High cholesterol   . Hypertension   . Migraine without aura, with intractable migraine, so stated, without mention of status migrainosus 08/31/2013  . Nephrolithiasis   . Other specified cardiac dysrhythmias(427.89)   . Vitiligo     Patient Active Problem List   Diagnosis Date Noted  . Schatzki's ring   . Reflux esophagitis   . Hiatal hernia   . Diverticulosis of colon without hemorrhage   . History of colonic polyps   . Family history of colon cancer 05/09/2014  . Dysphagia, pharyngoesophageal phase 05/09/2014  . Tubular adenoma of colon 05/09/2014  . Migraine without aura, with intractable migraine, so stated, without mention of status migrainosus 08/31/2013    Past Surgical History:  Procedure Laterality Date  . ABDOMINAL HYSTERECTOMY    . BACK SURGERY    . COLONOSCOPY  2011   Dr. Arnoldo Morale: sessile polyp transverse colon (tubular adenoma)   . COLONOSCOPY N/A 05/30/2014   Dr.  Rourk:multiple colonic polyps/colonic diverticulosis/internal hemorrhoids. Tubular adenomas. Surveillance in 5 years if health permits.   . ESOPHAGOGASTRODUODENOSCOPY N/A 05/30/2014   Dr. Rourk:schatzki's ring with superimposed erosive reflux esophagitis, s/p dilation. hiatal hernia  . MALONEY DILATION N/A 05/30/2014   Procedure: Venia Minks DILATION;  Surgeon: Daneil Dolin, MD;  Location: [facility];  Service: Endoscopy;  Laterality: N/A;  . NASAL SINUS SURGERY    . TONSILLECTOMY      OB History    No data available       Home Medications    Prior to Admission medications   Medication Sig Start Date End Date Taking? Authorizing Provider  ALOE VERA PO Take 1 tablet by mouth daily.   Yes [provider]  aspirin EC 81 MG tablet Take 81 mg by mouth daily.   Yes [provider]  Biotin 10000 MCG TABS Take 1 tablet by mouth daily.   Yes [provider]  hydrochlorothiazide (HYDRODIURIL) 25 MG tablet Take 25 mg by mouth daily. 08/15/13  Yes [provider]  ramipril (ALTACE) 10 MG capsule Take 10 mg by mouth daily. 06/27/13  Yes [provider]  simvastatin (ZOCOR) 10 MG tablet Take 10 mg by mouth daily.    Yes [provider]    Family History Family History  Problem Relation Age of Onset  . Hypertension Mother   . Cancer - Colon Mother   .  Hypertension Father   . Colon cancer Father     Social History Social History  Substance Use Topics  . Smoking status: Never Smoker  . Smokeless tobacco: Never Used  . Alcohol use No     Allergies   Codeine   Review of Systems Review of Systems  Constitutional: Negative for appetite change and fatigue.  HENT: Negative for congestion, ear discharge and sinus pressure.   Eyes: Negative for discharge.  Respiratory: Negative for cough.   Cardiovascular: Positive for chest pain.  Gastrointestinal: Negative for abdominal pain and diarrhea.  Genitourinary: Negative for frequency and  hematuria.  Musculoskeletal: Negative for back pain.  Skin: Negative for rash.  Neurological: Negative for seizures and headaches.  Psychiatric/Behavioral: Negative for hallucinations.     Physical Exam Updated Vital Signs BP (!) 173/75   Pulse (!) 55   Temp 98.4 F (36.9 C) (Oral)   Resp 13   Ht 5\' 2"  (1.575 m)   Wt 103 kg (227 lb)   SpO2 98%   BMI 41.52 kg/m   Physical Exam  Constitutional: She is oriented to person, place, and time. She appears well-developed.  HENT:  Head: Normocephalic.  Eyes: Conjunctivae and EOM are normal. No scleral icterus.  Neck: Neck supple. No thyromegaly present.  Cardiovascular: Normal rate and regular rhythm.  Exam reveals no gallop and no friction rub.   No murmur heard. Pulmonary/Chest: No stridor. She has no wheezes. She has no rales. She exhibits no tenderness.  Abdominal: She exhibits no distension. There is no tenderness. There is no rebound.  Musculoskeletal: Normal range of motion. She exhibits no edema.  Lymphadenopathy:    She has no cervical adenopathy.  Neurological: She is oriented to person, place, and time. She exhibits normal muscle tone. Coordination normal.  Skin: No rash noted. No erythema.  Psychiatric: She has a normal mood and affect. Her behavior is normal.     ED Treatments / Results  Labs (all labs ordered are listed, but only abnormal results are displayed) Labs Reviewed  BASIC METABOLIC PANEL - Abnormal; Notable for the following:       Result Value   Glucose, Bld 174 (*)    All other components within normal limits  CBC - Abnormal; Notable for the following:    RBC 5.14 (*)    Hemoglobin 16.0 (*)    HCT 46.8 (*)    All other components within normal limits  TROPONIN I  HEPATIC FUNCTION PANEL  DIFFERENTIAL  TROPONIN I    EKG  EKG Interpretation  Date/Time:  Thursday January 07 2017 10:29:09 EDT Ventricular Rate:  69 PR Interval:  130 QRS Duration: 90 QT Interval:  408 QTC  Calculation: 437 R Axis:   -20 Text Interpretation:  Normal sinus rhythm Septal infarct , age undetermined Abnormal ECG Confirmed by Milton Ferguson [phone]) on 01/07/2017 11:55:14 AM       Radiology Dg Chest 2 View  Result Date: 01/07/2017 CLINICAL DATA:  76 year old female with a history of chest tightness for 1 week EXAM: CHEST  2 VIEW COMPARISON:  12/27/2008 FINDINGS: Cardiomediastinal silhouette unchanged in size and contour. No evidence of central vascular congestion. No pneumothorax or pleural effusion. No confluent airspace disease. Coarsened interstitial markings similar to the prior. No displaced fracture.  Degenerative changes of the spine. IMPRESSION: No radiographic evidence of acute cardiopulmonary disease. Electronically Signed   By: Corrie Mckusick D.O.   On: 01/07/2017 10:52    Procedures Procedures (including critical care time)  Medications  Ordered in ED Medications  nitroGLYCERIN (NITROSTAT) SL tablet 0.4 mg (0.4 mg Sublingual Given 01/07/17 1526)     Initial Impression / Assessment and Plan / ED Course  I have reviewed the triage vital signs and the nursing notes.  Pertinent labs & imaging results that were available during my care of the patient were reviewed by me and considered in my medical decision making (see chart for details).     Patient with chest pressure that is suspicious for coronary artery disease. She will be seen by cardiology and medicine will admit patient for further workup  Final Clinical Impressions(s) / ED Diagnoses   Final diagnoses:  None    New Prescriptions New Prescriptions   No medications on file     Milton Ferguson, MD 01/07/17 1544

## 2017-01-08 ENCOUNTER — Observation Stay (HOSPITAL_BASED_OUTPATIENT_CLINIC_OR_DEPARTMENT_OTHER): Payer: Medicare Other

## 2017-01-08 ENCOUNTER — Encounter (HOSPITAL_COMMUNITY): Payer: Self-pay

## 2017-01-08 DIAGNOSIS — I10 Essential (primary) hypertension: Secondary | ICD-10-CM | POA: Diagnosis not present

## 2017-01-08 DIAGNOSIS — E781 Pure hyperglyceridemia: Secondary | ICD-10-CM | POA: Diagnosis not present

## 2017-01-08 DIAGNOSIS — R079 Chest pain, unspecified: Secondary | ICD-10-CM | POA: Diagnosis not present

## 2017-01-08 LAB — NM MYOCAR MULTI W/SPECT W/WALL MOTION / EF
CSEPPHR: 90 {beats}/min
LVDIAVOL: 53 mL (ref 46–106)
LVSYSVOL: 15 mL
RATE: 0.42
Rest HR: 65 {beats}/min
SDS: 0
SRS: 2
SSS: 2
TID: 0.99

## 2017-01-08 LAB — GLUCOSE, CAPILLARY
GLUCOSE-CAPILLARY: 133 mg/dL — AB (ref 65–99)
GLUCOSE-CAPILLARY: 119 mg/dL — AB (ref 65–99)
Glucose-Capillary: 130 mg/dL — ABNORMAL HIGH (ref 65–99)

## 2017-01-08 LAB — HEMOGLOBIN A1C
HEMOGLOBIN A1C: 6.1 % — AB (ref 4.8–5.6)
Mean Plasma Glucose: 128.37 mg/dL

## 2017-01-08 LAB — LIPID PANEL
CHOL/HDL RATIO: 4.3 ratio
Cholesterol: 141 mg/dL (ref 0–200)
HDL: 33 mg/dL — AB (ref >40)
LDL CALC: 75 mg/dL (ref 0–99)
TRIGLYCERIDES: 163 mg/dL — AB (ref <150)
VLDL: 33 mg/dL (ref 0–40)

## 2017-01-08 MED ORDER — SODIUM CHLORIDE 0.9% FLUSH
INTRAVENOUS | Status: AC
  Administered 2017-01-08: 10 mL via INTRAVENOUS
  Filled 2017-01-08: qty 10

## 2017-01-08 MED ORDER — TECHNETIUM TC 99M TETROFOSMIN IV KIT
10.0000 | PACK | Freq: Once | INTRAVENOUS | Status: AC | PRN
  Administered 2017-01-08: 10 via INTRAVENOUS

## 2017-01-08 MED ORDER — RAMIPRIL 10 MG PO CAPS: 20.0000 mg | ORAL_CAPSULE | Freq: Every day | ORAL | Status: DC

## 2017-01-08 MED ORDER — REGADENOSON 0.4 MG/5ML IV SOLN
INTRAVENOUS | Status: AC
  Administered 2017-01-08: 0.4 mg via INTRAVENOUS
  Filled 2017-01-08: qty 5

## 2017-01-08 MED ORDER — TECHNETIUM TC 99M TETROFOSMIN IV KIT
30.0000 | PACK | Freq: Once | INTRAVENOUS | Status: AC | PRN
  Administered 2017-01-08: 30 via INTRAVENOUS

## 2017-01-08 MED ORDER — REGADENOSON 0.4 MG/5ML IV SOLN
0.4000 mg | Freq: Once | INTRAVENOUS | Status: AC
  Administered 2017-01-08: 0.4 mg via INTRAVENOUS
  Filled 2017-01-08: qty 5

## 2017-01-08 MED ORDER — RAMIPRIL 10 MG PO CAPS: 20.0000 mg | ORAL_CAPSULE | Freq: Every day | ORAL | 2 refills | Status: AC

## 2017-01-08 MED ORDER — RAMIPRIL 5 MG PO CAPS: 20.0000 mg | ORAL_CAPSULE | Freq: Every day | ORAL | Status: DC

## 2017-01-08 NOTE — Progress Notes (Signed)
EKG completed and placed on pts chart.  

## 2017-01-08 NOTE — Care Management Obs Status (Signed)
[place] NOTIFICATION   Patient Details  Name: Kimberly Miranda MRN: [id number] Date of Birth: 08-01-1940   Medicare Observation Status Notification Given:  Yes    Taras Rask, Chauncey Reading, RN 01/08/2017, 10:33 AM

## 2017-01-08 NOTE — Care Management Note (Signed)
Case Management Note  Patient Details  Name: MAKIYA JEUNE MRN: [id number] Date of Birth: 1940/05/25  Subjective/Objective:                  Adm with CP. From home alone, ind PTA. No HH or DME PTA. Stress test today.   Action/Plan: DC home with self care. No CM needs.  Expected Discharge Date:    01/08/2017              Expected Discharge Plan:  Home/Self Care  In-House Referral:     Discharge planning Services  CM Consult  Post Acute Care Choice:  NA Choice offered to:  NA  DME Arranged:    DME Agency:     HH Arranged:    HH Agency:     Status of Service:  Completed, signed off  If discussed at [employer] of Stay Meetings, dates discussed:    Additional Comments:  Zyad Boomer, Chauncey Reading, RN 01/08/2017, 10:33 AM

## 2017-01-08 NOTE — Discharge Summary (Addendum)
Physician Discharge Summary  Kimberly Miranda LKT:[id number] DOB: 07-22-1940 DOA: 01/07/2017  PCP: Asencion Noble, MD  Admit date: 01/07/2017 Discharge date: 01/08/2017  Time spent: 45 minutes  Recommendations for Outpatient Follow-up:  -To be discharged home today. -Advised follow-up with PCP in 2 weeks.   Discharge Diagnoses:  Principal Problem:   Chest pain Active Problems:   Essential hypertension   Diabetes mellitus type 2 in obese [facility])   Hyperlipidemia   Discharge Condition: Stable and improved  Filed Weights   01/07/17 1029 01/07/17 1824  Weight: 103 kg (227 lb) 107.1 kg (236 lb 1.6 oz)    History of present illness:  As per Dr. Lorin Mercy on 9/6: Kimberly Miranda is a 76 y.o. female with medical history significant of HTN, HLD, DM presenting with CP.  She reports that her BP has been fluctuating for the last month.  She started a few weeks ago feeling weak and dizzy.  After a few days that went away and she developed intermittent chest pain.  Now it is all the time.  She gets very shaky.  Substernal chest pain, previously coming and going but it woke her up yesterday AM and has been constant.  She called Dr. Willey Blade this AM and he suggested she come in.  Non-exertional, constant and unchanging pain regardless of what she does.  No alleviating factors.  No change in the ER including with NTG.  +SOB for a few months.  It comes and goes but seems to be separate from the chest pain.  She has gained weight so she is blaming that for the SOB.  She has gained 35 pounds in 3 years, 10 in the last year.  She takes Miralax for constipation.   ED Course: Chest pressure suspicious for CAD.  Cardiology called; Dr. Harl Bowie recommends r/o, NPO after MN, prn morphine and NTG, start Heparin if enzymes positive or EKG change  Hospital Course:   Chest pain -Ruled out for ACS via negative troponins and EKG without acute ischemic changes. -Had a negative stress test. -Seen by cardiology and per them okay  for discharge home today without further workup.  Rest of chronic medical issues have been stable during this hospitalization.  Procedures:  Nuclear medicine stress test as above   Consultations:  Cardiology  Discharge Instructions  Discharge Instructions    Diet - low sodium heart healthy    Complete by:  As directed    Increase activity slowly    Complete by:  As directed      Allergies as of 01/08/2017      Reactions   Codeine Other (See Comments)   Stomach upset      Medication List    TAKE these medications   ALOE VERA PO Take 1 tablet by mouth daily.   aspirin EC 81 MG tablet Take 81 mg by mouth daily.   Biotin 10000 MCG Tabs Take 1 tablet by mouth daily.   hydrochlorothiazide 25 MG tablet Commonly known as:  HYDRODIURIL Take 25 mg by mouth daily.   ramipril 10 MG capsule Commonly known as:  ALTACE Take 2 capsules (20 mg total) by mouth daily. What changed:  how much to take   simvastatin 10 MG tablet Commonly known as:  ZOCOR Take 10 mg by mouth daily.            Discharge Care Instructions        Start     Ordered   01/08/17 0000  Increase  activity slowly     01/08/17 1530   01/08/17 0000  Diet - low sodium heart healthy     01/08/17 1530   01/08/17 0000  ramipril (ALTACE) 10 MG capsule  Daily     01/08/17 1643     Allergies  Allergen Reactions  . Codeine Other (See Comments)    Stomach upset   Follow-up Information    Asencion Noble, MD. Schedule an appointment as soon as possible for a visit in 2 week(s).   Specialty:  Internal Medicine Contact information: [address] [place] [address] [phone]            The results of significant diagnostics from this hospitalization (including imaging, microbiology, ancillary and laboratory) are listed below for reference.    Significant Diagnostic Studies: Dg Chest 2 View  Result Date: 01/07/2017 CLINICAL DATA:  76 year old female with a history of chest  tightness for 1 week EXAM: CHEST  2 VIEW COMPARISON:  12/27/2008 FINDINGS: Cardiomediastinal silhouette unchanged in size and contour. No evidence of central vascular congestion. No pneumothorax or pleural effusion. No confluent airspace disease. Coarsened interstitial markings similar to the prior. No displaced fracture.  Degenerative changes of the spine. IMPRESSION: No radiographic evidence of acute cardiopulmonary disease. Electronically Signed   By: Corrie Mckusick D.O.   On: 01/07/2017 10:52   Nm Myocar Multi W/spect W/wall Motion / Ef  Result Date: 01/08/2017  There was no ST segment deviation noted during stress.  The study is normal. There are no perfusion defects consistent with prior infarct or current ischemia.  This is a low risk study.  The left ventricular ejection fraction is hyperdynamic (>65%).     Microbiology: No results found for this or any previous visit (from the past 240 hour(s)).   Labs: Basic Metabolic Panel:  Recent Labs Lab 01/07/17 1107  NA 140  K 3.6  CL 102  CO2 29  GLUCOSE 174*  BUN 18  CREATININE 0.79  CALCIUM 9.1   Liver Function Tests:  Recent Labs Lab 01/07/17 1155  AST 23  ALT 22  ALKPHOS 61  BILITOT 1.0  PROT 6.6  ALBUMIN 3.8   No results for input(s): LIPASE, AMYLASE in the last 168 hours. No results for input(s): AMMONIA in the last 168 hours. CBC:  Recent Labs Lab 01/07/17 1107  WBC 8.0  NEUTROABS 4.6  HGB 16.0*  HCT 46.8*  MCV 91.1  PLT 247   Cardiac Enzymes:  Recent Labs Lab 01/07/17 1107 01/07/17 1420 01/07/17 2017  TROPONINI <0.03 <0.03 <0.03   BNP: BNP (last 3 results) No results for input(s): BNP in the last 8760 hours.  ProBNP (last 3 results) No results for input(s): PROBNP in the last 8760 hours.  CBG:  Recent Labs Lab 01/07/17 2043 01/08/17 0438 01/08/17 0723 01/08/17 1113  GLUCAP 194* 130* 133* 119*       Signed:  HERNANDEZ ACOSTA,ESTELA  [facility] Pager:  [phone] 01/08/2017, 4:43 PM

## 2017-01-08 NOTE — Consult Note (Signed)
Cardiology Consultation:   Patient ID: Kimberly Miranda; [id number]; 08-09-1940   Admit date: 01/07/2017 Date of Consult: 01/08/2017  Primary Care Provider: Asencion Noble, MD Primary Cardiologist: Xaiver Roskelley   Patient Profile:   Kimberly Miranda is a 76 y.o. female with a hx of  Hypertension, HLD, Diabetes, who is being seen today for the evaluation of chest pain at the request of Dr. Jerilee Hoh.   History of Present Illness:   Ms. Kimberly Miranda, who has no prior cardiac history, presented to ER with complaints of chest pain,  described as pressure substernal nonradiating, with generalized weakness fatigue and worsening blood pressure over the last 2 weeks. The patient states she has not been feeling well and has been noticing that her blood pressure has been rising despite taking medications as directed. 3 days ago the patient had some substernal chest pressure that lasted all day, it was not intermittent. The following day she felt it again lasting all day, noticed her blood pressure remained elevated. On the third day the pressure became worse with associated mild shortness of breath and ongoing fatigue, and therefore presented to the emergency room.   On arrival blood pressure was elevated at 192/79, heart rate 71, O2 sat 96% she was afebrile. Troponins have been negative 3. EKG revealed normal sinus rhythm without acute ST-T wave abnormalities. Pertinent labs revealed elevated blood glucose of 174, creatinine 0.79. She was not found to be anemic, thrombocytopenic, or had leukocytosis. Chest x-ray revealed no acute cardiopulmonary disease. The patient was given sublingual nitroglycerin and admitted to rule out ACS.  At this time, the patient is without discomfort with exception of lingering substernal pressure which is minimal. She denies racing heart rate, near syncope, dizziness, nausea vomiting, or diarrhea prior to this event or events leading to admission.  Past Medical History:  Diagnosis Date  .  Diabetes ([place])    diet controlled since 2014  . Edema   . GERD (gastroesophageal reflux disease)   . High cholesterol   . Hypertension   . Migraine without aura, with intractable migraine, so stated, without mention of status migrainosus 08/31/2013  . Nephrolithiasis   . Other specified cardiac dysrhythmias(427.89)   . Vitiligo     Past Surgical History:  Procedure Laterality Date  . ABDOMINAL HYSTERECTOMY    . BACK SURGERY    . COLONOSCOPY  2011   Dr. Arnoldo Morale: sessile polyp transverse colon (tubular adenoma)   . COLONOSCOPY N/A 05/30/2014   Dr. Rourk:multiple colonic polyps/colonic diverticulosis/internal hemorrhoids. Tubular adenomas. Surveillance in 5 years if health permits.   . ESOPHAGOGASTRODUODENOSCOPY N/A 05/30/2014   Dr. Rourk:schatzki's ring with superimposed erosive reflux esophagitis, s/p dilation. hiatal hernia  . MALONEY DILATION N/A 05/30/2014   Procedure: Venia Minks DILATION;  Surgeon: Daneil Dolin, MD;  Location: [facility];  Service: Endoscopy;  Laterality: N/A;  . NASAL SINUS SURGERY    . TONSILLECTOMY       Home Medications:  Prior to Admission medications   Medication Sig Start Date End Date Taking? Authorizing Provider  ALOE VERA PO Take 1 tablet by mouth daily.   Yes [provider]  aspirin EC 81 MG tablet Take 81 mg by mouth daily.   Yes [provider]  Biotin 10000 MCG TABS Take 1 tablet by mouth daily.   Yes [provider]  hydrochlorothiazide (HYDRODIURIL) 25 MG tablet Take 25 mg by mouth daily. 08/15/13  Yes [provider]  ramipril (ALTACE) 10 MG capsule Take 10 mg by  mouth daily. 06/27/13  Yes [provider]  simvastatin (ZOCOR) 10 MG tablet Take 10 mg by mouth daily.    Yes [provider]    Inpatient Medications: Scheduled Meds: . aspirin EC  81 mg Oral Daily  . enoxaparin (LOVENOX) injection  40 mg Subcutaneous Q24H  . hydrochlorothiazide  25 mg Oral Daily  . insulin aspart  0-15 Units  Subcutaneous TID WC  . insulin aspart  0-5 Units Subcutaneous QHS  . ramipril  10 mg Oral Daily  . simvastatin  10 mg Oral q1800   Continuous Infusions:  PRN Meds: acetaminophen, gi cocktail, hydrALAZINE, morphine injection, ondansetron (ZOFRAN) IV  Allergies:    Allergies  Allergen Reactions  . Codeine Other (See Comments)    Stomach upset    Social History:   Social History   Social History  . Marital status: Widowed    Spouse name: N/A  . Number of children: 3  . Years of education: hs   Occupational History  . Retired    Social History Main Topics  . Smoking status: Never Smoker  . Smokeless tobacco: Never Used  . Alcohol use 0.0 oz/week     Comment: rare use  . Drug use: No  . Sexual activity: Yes    Birth control/ protection: Surgical   Other Topics Concern  . Not on file   Social History Narrative   Patient is married with 3 children   Patient is right handed   Patient has a high school education   Patient does not drink any caffeine, occ chocolate candy    Family History:    Family History  Problem Relation Age of Onset  . Hypertension Mother   . Cancer - Colon Mother   . Hypertension Father   . Colon cancer Father      ROS:  Please see the history of present illness.  ROS  All other ROS reviewed and negative.     Physical Exam/Data:   Vitals:   01/07/17 1824 01/07/17 2042 01/08/17 0225 01/08/17 0439  BP: (!) 149/64 (!) 155/54 (!) 154/62 (!) 149/63  Pulse: 64 62 72 60  Resp: 20 20 19 16   Temp: (!) 97.5 F (36.4 C) 98 F (36.7 C) 97.7 F (36.5 C) 97.8 F (36.6 C)  TempSrc: Oral Oral Oral   SpO2: 98% 97% 96% 97%  Weight: 236 lb 1.6 oz (107.1 kg)     Height: 5\' 2"  (1.575 m)       Intake/Output Summary (Last 24 hours) at 01/08/17 0850 Last data filed at 01/08/17 0840  Gross per 24 hour  Intake              120 ml  Output              650 ml  Net             -530 ml   Filed Weights   01/07/17 1029 01/07/17 1824  Weight: 227  lb (103 kg) 236 lb 1.6 oz (107.1 kg)   Body mass index is 43.18 kg/m.  General:  Well nourished, well developed, in no acute distress HEENT: normal Lymph: no adenopathy Neck: no JVD Endocrine:  No thryomegaly Vascular: No carotid bruits; FA pulses 2+ bilaterally without bruits  Cardiac:  normal S1, S2; S4 murmur RRR;  Lungs:  clear to auscultation bilaterally, no wheezing, rhonchi or rales  Abd: soft, nontender, no hepatomegaly  Ext: no edema Musculoskeletal:  No deformities, BUE and BLE strength  normal and equal Skin: warm and dry  Neuro:  CNs 2-12 intact, no focal abnormalities noted Psych:  Normal affect   EKG:  The EKG was personally reviewed and demonstrates:  NSR with rate of 69 bpm, septal Q waves.  Telemetry:  Telemetry was personally reviewed and demonstrates:  NSR rates in the 60's and 70's.   Relevant CV Studies: None  Laboratory Data:  Chemistry Recent Labs Lab 01/07/17 1107  NA 140  K 3.6  CL 102  CO2 29  GLUCOSE 174*  BUN 18  CREATININE 0.79  CALCIUM 9.1  GFRNONAA >60  GFRAA >60  ANIONGAP 9     Recent Labs Lab 01/07/17 1155  PROT 6.6  ALBUMIN 3.8  AST 23  ALT 22  ALKPHOS 61  BILITOT 1.0   Hematology Recent Labs Lab 01/07/17 1107  WBC 8.0  RBC 5.14*  HGB 16.0*  HCT 46.8*  MCV 91.1  MCH 31.1  MCHC 34.2  RDW 13.9  PLT 247   Cardiac Enzymes Recent Labs Lab 01/07/17 1107 01/07/17 1420 01/07/17 2017  TROPONINI <0.03 <0.03 <0.03   No results for input(s): TROPIPOC in the last 168 hours.  BNPNo results for input(s): BNP, PROBNP in the last 168 hours.  DDimer No results for input(s): DDIMER in the last 168 hours.  Radiology/Studies:  Dg Chest 2 View  Result Date: 01/07/2017 CLINICAL DATA:  76 year old female with a history of chest tightness for 1 week EXAM: CHEST  2 VIEW COMPARISON:  12/27/2008 FINDINGS: Cardiomediastinal silhouette unchanged in size and contour. No evidence of central vascular congestion. No pneumothorax or  pleural effusion. No confluent airspace disease. Coarsened interstitial markings similar to the prior. No displaced fracture.  Degenerative changes of the spine. IMPRESSION: No radiographic evidence of acute cardiopulmonary disease. Electronically Signed   By: Corrie Mckusick D.O.   On: 01/07/2017 10:52    Assessment and Plan:   1. Chest pain: EKG and troponins argue against ACS. She does have cardiovascular risk factors include hypertension, hyperlipidemia, age, obesity, and diabetes. The patient is currently without chest discomfort. Will plan Lexiscan Myoview this a.m. for diagnostic prognostic purposes and a patient with multiple cardiovascular risk factors. I've explained the test to her, indications for completing the test, she verbalizes understanding and is willing to proceed. We'll plan stress test this a.m.  2. Hypertension: Blood pressure was elevated on admission. It is moderately controlled at this time. Review of home medications Halcion) ramipril 10 mg daily and HCTZ 25 mg daily. May need adjustments. Will await results of stress test prior to making further recommendations.   3. Hypercholesterolemia: Continue statin therapy. Lipid profile this admission reveals a cholesterol of 141 HDL 33 LDL 75 triglycerides 163.  4. Obesity: Decreased caloric intake and increase activity is recommended.   Signed, Jory Sims DNP, ANP, AACC.  01/08/2017 8:50 AM   Patient seen and discussed with DNP Purcell Nails, I agree with her documentation above. No objective evidence for ischemia based on cardiac enzymes and EKG. Stress test is negative for ischemia. No further cardiac testing planned at this time, we will sign off inpatient care.   Carlyle Dolly MD

## 2017-01-21 DIAGNOSIS — I10 Essential (primary) hypertension: Secondary | ICD-10-CM | POA: Diagnosis not present

## 2017-01-22 ENCOUNTER — Ambulatory Visit: Payer: Medicare Other | Admitting: Adult Health

## 2017-02-19 DIAGNOSIS — G4700 Insomnia, unspecified: Secondary | ICD-10-CM | POA: Diagnosis not present

## 2017-02-19 DIAGNOSIS — Z23 Encounter for immunization: Secondary | ICD-10-CM | POA: Diagnosis not present

## 2017-02-19 DIAGNOSIS — I10 Essential (primary) hypertension: Secondary | ICD-10-CM | POA: Diagnosis not present

## 2017-02-23 ENCOUNTER — Other Ambulatory Visit (HOSPITAL_COMMUNITY): Payer: Self-pay | Admitting: Internal Medicine

## 2017-02-23 DIAGNOSIS — Z1231 Encounter for screening mammogram for malignant neoplasm of breast: Secondary | ICD-10-CM

## 2017-02-24 ENCOUNTER — Ambulatory Visit (HOSPITAL_COMMUNITY)
Admission: RE | Admit: 2017-02-24 | Discharge: 2017-02-24 | Disposition: A | Discharge status: Home / Self Care | Payer: Medicare Other | Source: Ambulatory Visit | Attending: Internal Medicine | Admitting: Internal Medicine

## 2017-02-24 DIAGNOSIS — Z1231 Encounter for screening mammogram for malignant neoplasm of breast: Secondary | ICD-10-CM | POA: Diagnosis not present

## 2017-02-24 DIAGNOSIS — R921 Mammographic calcification found on diagnostic imaging of breast: Secondary | ICD-10-CM | POA: Diagnosis not present

## 2017-03-01 ENCOUNTER — Other Ambulatory Visit (HOSPITAL_COMMUNITY): Payer: Self-pay | Admitting: Internal Medicine

## 2017-03-01 DIAGNOSIS — R921 Mammographic calcification found on diagnostic imaging of breast: Secondary | ICD-10-CM

## 2017-03-15 ENCOUNTER — Encounter (HOSPITAL_COMMUNITY): Payer: Self-pay

## 2017-03-15 ENCOUNTER — Ambulatory Visit (HOSPITAL_COMMUNITY)
Admission: RE | Admit: 2017-03-15 | Discharge: 2017-03-15 | Disposition: A | Discharge status: Home / Self Care | Payer: Medicare Other | Source: Ambulatory Visit | Attending: Internal Medicine | Admitting: Internal Medicine

## 2017-03-15 DIAGNOSIS — R921 Mammographic calcification found on diagnostic imaging of breast: Secondary | ICD-10-CM

## 2017-03-15 DIAGNOSIS — R928 Other abnormal and inconclusive findings on diagnostic imaging of breast: Secondary | ICD-10-CM | POA: Diagnosis not present

## 2017-03-23 ENCOUNTER — Other Ambulatory Visit: Payer: Self-pay | Admitting: Internal Medicine

## 2017-03-23 DIAGNOSIS — K210 Gastro-esophageal reflux disease with esophagitis: Secondary | ICD-10-CM | POA: Diagnosis not present

## 2017-03-23 DIAGNOSIS — R928 Other abnormal and inconclusive findings on diagnostic imaging of breast: Secondary | ICD-10-CM

## 2017-03-23 DIAGNOSIS — I10 Essential (primary) hypertension: Secondary | ICD-10-CM | POA: Diagnosis not present

## 2017-03-23 DIAGNOSIS — R921 Mammographic calcification found on diagnostic imaging of breast: Secondary | ICD-10-CM

## 2017-03-29 ENCOUNTER — Ambulatory Visit
Admission: RE | Admit: 2017-03-29 | Discharge: 2017-03-29 | Disposition: A | Discharge status: Home / Self Care | Payer: Medicare Other | Source: Ambulatory Visit | Attending: Internal Medicine | Admitting: Internal Medicine

## 2017-03-29 DIAGNOSIS — D242 Benign neoplasm of left breast: Secondary | ICD-10-CM | POA: Diagnosis not present

## 2017-03-29 DIAGNOSIS — R921 Mammographic calcification found on diagnostic imaging of breast: Secondary | ICD-10-CM

## 2017-03-29 DIAGNOSIS — R928 Other abnormal and inconclusive findings on diagnostic imaging of breast: Secondary | ICD-10-CM

## 2017-04-02 ENCOUNTER — Other Ambulatory Visit: Payer: Self-pay | Admitting: Obstetrics and Gynecology

## 2017-04-15 DIAGNOSIS — N390 Urinary tract infection, site not specified: Secondary | ICD-10-CM | POA: Diagnosis not present

## 2017-04-15 DIAGNOSIS — I10 Essential (primary) hypertension: Secondary | ICD-10-CM | POA: Diagnosis not present

## 2017-04-23 DIAGNOSIS — H40023 Open angle with borderline findings, high risk, bilateral: Secondary | ICD-10-CM | POA: Diagnosis not present

## 2017-04-23 DIAGNOSIS — Z961 Presence of intraocular lens: Secondary | ICD-10-CM | POA: Diagnosis not present

## 2017-05-14 DIAGNOSIS — Z6841 Body Mass Index (BMI) 40.0 and over, adult: Secondary | ICD-10-CM | POA: Diagnosis not present

## 2017-05-14 DIAGNOSIS — J209 Acute bronchitis, unspecified: Secondary | ICD-10-CM | POA: Diagnosis not present

## 2017-05-14 DIAGNOSIS — R05 Cough: Secondary | ICD-10-CM | POA: Diagnosis not present

## 2017-06-02 DIAGNOSIS — R05 Cough: Secondary | ICD-10-CM | POA: Diagnosis not present

## 2017-06-02 DIAGNOSIS — J069 Acute upper respiratory infection, unspecified: Secondary | ICD-10-CM | POA: Diagnosis not present

## 2017-06-04 ENCOUNTER — Emergency Department (HOSPITAL_COMMUNITY): Payer: Medicare Other

## 2017-06-04 ENCOUNTER — Other Ambulatory Visit: Payer: Self-pay

## 2017-06-04 ENCOUNTER — Observation Stay (HOSPITAL_COMMUNITY)
Admission: EM | Admit: 2017-06-04 | Discharge: 2017-06-05 | Disposition: A | Discharge status: Home / Self Care | Payer: Medicare Other | Attending: Internal Medicine | Admitting: Internal Medicine

## 2017-06-04 ENCOUNTER — Encounter (HOSPITAL_COMMUNITY): Payer: Self-pay

## 2017-06-04 DIAGNOSIS — E1169 Type 2 diabetes mellitus with other specified complication: Secondary | ICD-10-CM | POA: Diagnosis present

## 2017-06-04 DIAGNOSIS — J209 Acute bronchitis, unspecified: Secondary | ICD-10-CM | POA: Diagnosis not present

## 2017-06-04 DIAGNOSIS — E119 Type 2 diabetes mellitus without complications: Secondary | ICD-10-CM | POA: Diagnosis not present

## 2017-06-04 DIAGNOSIS — Z7982 Long term (current) use of aspirin: Secondary | ICD-10-CM | POA: Insufficient documentation

## 2017-06-04 DIAGNOSIS — E876 Hypokalemia: Secondary | ICD-10-CM | POA: Insufficient documentation

## 2017-06-04 DIAGNOSIS — G43719 Chronic migraine without aura, intractable, without status migrainosus: Secondary | ICD-10-CM | POA: Insufficient documentation

## 2017-06-04 DIAGNOSIS — G43019 Migraine without aura, intractable, without status migrainosus: Secondary | ICD-10-CM | POA: Diagnosis present

## 2017-06-04 DIAGNOSIS — R0603 Acute respiratory distress: Secondary | ICD-10-CM | POA: Insufficient documentation

## 2017-06-04 DIAGNOSIS — J40 Bronchitis, not specified as acute or chronic: Secondary | ICD-10-CM

## 2017-06-04 DIAGNOSIS — R05 Cough: Secondary | ICD-10-CM | POA: Diagnosis not present

## 2017-06-04 DIAGNOSIS — R51 Headache: Secondary | ICD-10-CM | POA: Diagnosis not present

## 2017-06-04 DIAGNOSIS — Z79899 Other long term (current) drug therapy: Secondary | ICD-10-CM | POA: Insufficient documentation

## 2017-06-04 DIAGNOSIS — K2100 Gastro-esophageal reflux disease with esophagitis, without bleeding: Secondary | ICD-10-CM | POA: Diagnosis present

## 2017-06-04 DIAGNOSIS — E669 Obesity, unspecified: Secondary | ICD-10-CM

## 2017-06-04 DIAGNOSIS — R0602 Shortness of breath: Secondary | ICD-10-CM | POA: Diagnosis present

## 2017-06-04 LAB — CBC WITH DIFFERENTIAL/PLATELET
BASOS ABS: 0 10*3/uL (ref 0.0–0.1)
BASOS PCT: 1 %
EOS ABS: 0.1 10*3/uL (ref 0.0–0.7)
EOS PCT: 1 %
HEMATOCRIT: 43.3 % (ref 36.0–46.0)
Hemoglobin: 14.5 g/dL (ref 12.0–15.0)
Lymphocytes Relative: 20 %
Lymphs Abs: 1.2 10*3/uL (ref 0.7–4.0)
MCH: 29.8 pg (ref 26.0–34.0)
MCHC: 33.5 g/dL (ref 30.0–36.0)
MCV: 89.1 fL (ref 78.0–100.0)
MONO ABS: 0.8 10*3/uL (ref 0.1–1.0)
MONOS PCT: 14 %
NEUTROS ABS: 3.6 10*3/uL (ref 1.7–7.7)
Neutrophils Relative %: 64 %
PLATELETS: 204 10*3/uL (ref 150–400)
RBC: 4.86 MIL/uL (ref 3.87–5.11)
RDW: 14.3 % (ref 11.5–15.5)
WBC: 5.6 10*3/uL (ref 4.0–10.5)

## 2017-06-04 LAB — BASIC METABOLIC PANEL
ANION GAP: 13 (ref 5–15)
BUN: 16 mg/dL (ref 6–20)
CALCIUM: 8.8 mg/dL — AB (ref 8.9–10.3)
CO2: 25 mmol/L (ref 22–32)
Chloride: 96 mmol/L — ABNORMAL LOW (ref 101–111)
Creatinine, Ser: 0.84 mg/dL (ref 0.44–1.00)
GFR calc non Af Amer: >60 mL/min (ref >60)
Glucose, Bld: 227 mg/dL — ABNORMAL HIGH (ref 65–99)
Potassium: 3.3 mmol/L — ABNORMAL LOW (ref 3.5–5.1)
Sodium: 134 mmol/L — ABNORMAL LOW (ref 135–145)

## 2017-06-04 LAB — BRAIN NATRIURETIC PEPTIDE: B Natriuretic Peptide: 56 pg/mL (ref 0.0–100.0)

## 2017-06-04 LAB — GLUCOSE, CAPILLARY
Glucose-Capillary: 192 mg/dL — ABNORMAL HIGH (ref 65–99)
Glucose-Capillary: 354 mg/dL — ABNORMAL HIGH (ref 65–99)

## 2017-06-04 LAB — TROPONIN I

## 2017-06-04 MED ORDER — ONDANSETRON HCL 4 MG/2ML IJ SOLN: 4.0000 mg | Freq: Four times a day (QID) | INTRAMUSCULAR | Status: DC | PRN

## 2017-06-04 MED ORDER — ALBUTEROL SULFATE (2.5 MG/3ML) 0.083% IN NEBU
2.5000 mg | INHALATION_SOLUTION | Freq: Four times a day (QID) | RESPIRATORY_TRACT | Status: DC
  Administered 2017-06-04 – 2017-06-05 (×4): 2.5 mg via RESPIRATORY_TRACT
  Filled 2017-06-04 (×4): qty 3

## 2017-06-04 MED ORDER — ACETAMINOPHEN 325 MG PO TABS: 650.0000 mg | ORAL_TABLET | Freq: Four times a day (QID) | ORAL | Status: DC | PRN

## 2017-06-04 MED ORDER — METHYLPREDNISOLONE SODIUM SUCC 125 MG IJ SOLR
60.0000 mg | Freq: Every day | INTRAMUSCULAR | Status: DC
  Administered 2017-06-04 – 2017-06-05 (×2): 60 mg via INTRAVENOUS
  Filled 2017-06-04 (×2): qty 2

## 2017-06-04 MED ORDER — ALBUTEROL SULFATE (2.5 MG/3ML) 0.083% IN NEBU
2.5000 mg | INHALATION_SOLUTION | RESPIRATORY_TRACT | Status: DC | PRN
  Administered 2017-06-04: 2.5 mg via RESPIRATORY_TRACT
  Filled 2017-06-04: qty 3

## 2017-06-04 MED ORDER — HYDROCHLOROTHIAZIDE 25 MG PO TABS
25.0000 mg | ORAL_TABLET | Freq: Every day | ORAL | Status: DC
  Administered 2017-06-04 – 2017-06-05 (×2): 25 mg via ORAL
  Filled 2017-06-04 (×2): qty 1

## 2017-06-04 MED ORDER — GUAIFENESIN 100 MG/5ML PO SOLN
5.0000 mL | ORAL | Status: DC | PRN
Start: 2017-06-04 — End: 2017-06-05

## 2017-06-04 MED ORDER — DOXYCYCLINE HYCLATE 100 MG PO TABS
100.0000 mg | ORAL_TABLET | Freq: Two times a day (BID) | ORAL | Status: DC
  Administered 2017-06-04 – 2017-06-05 (×2): 100 mg via ORAL
  Filled 2017-06-04 (×2): qty 1

## 2017-06-04 MED ORDER — ONDANSETRON HCL 4 MG PO TABS: 4.0000 mg | ORAL_TABLET | Freq: Four times a day (QID) | ORAL | Status: DC | PRN

## 2017-06-04 MED ORDER — ACETAMINOPHEN 650 MG RE SUPP: 650.0000 mg | Freq: Four times a day (QID) | RECTAL | Status: DC | PRN

## 2017-06-04 MED ORDER — INSULIN ASPART 100 UNIT/ML ~~LOC~~ SOLN
0.0000 [IU] | Freq: Three times a day (TID) | SUBCUTANEOUS | Status: DC
  Administered 2017-06-04: 2 [IU] via SUBCUTANEOUS
  Administered 2017-06-05: 3 [IU] via SUBCUTANEOUS
  Administered 2017-06-05: 5 [IU] via SUBCUTANEOUS

## 2017-06-04 MED ORDER — INSULIN ASPART 100 UNIT/ML ~~LOC~~ SOLN
0.0000 [IU] | Freq: Every day | SUBCUTANEOUS | Status: DC
  Administered 2017-06-04: 5 [IU] via SUBCUTANEOUS

## 2017-06-04 MED ORDER — BIOTIN 10000 MCG PO TABS: 1.0000 | ORAL_TABLET | Freq: Every day | ORAL | Status: DC

## 2017-06-04 MED ORDER — ASPIRIN EC 81 MG PO TBEC
81.0000 mg | DELAYED_RELEASE_TABLET | Freq: Every day | ORAL | Status: DC
  Administered 2017-06-04 – 2017-06-05 (×2): 81 mg via ORAL
  Filled 2017-06-04 (×2): qty 1

## 2017-06-04 MED ORDER — ENOXAPARIN SODIUM 40 MG/0.4ML ~~LOC~~ SOLN
40.0000 mg | SUBCUTANEOUS | Status: DC
  Administered 2017-06-04: 40 mg via SUBCUTANEOUS
  Filled 2017-06-04: qty 0.4

## 2017-06-04 MED ORDER — RAMIPRIL 5 MG PO CAPS
20.0000 mg | ORAL_CAPSULE | Freq: Every day | ORAL | Status: DC
  Administered 2017-06-04 – 2017-06-05 (×2): 20 mg via ORAL
  Filled 2017-06-04 (×2): qty 4

## 2017-06-04 MED ORDER — POTASSIUM CHLORIDE CRYS ER 20 MEQ PO TBCR
40.0000 meq | EXTENDED_RELEASE_TABLET | Freq: Once | ORAL | Status: AC
  Administered 2017-06-04: 40 meq via ORAL
  Filled 2017-06-04: qty 2

## 2017-06-04 MED ORDER — ACETAMINOPHEN 500 MG PO TABS
1000.0000 mg | ORAL_TABLET | Freq: Once | ORAL | Status: AC
  Administered 2017-06-04: 1000 mg via ORAL
  Filled 2017-06-04: qty 2

## 2017-06-04 MED ORDER — GUAIFENESIN ER 600 MG PO TB12
600.0000 mg | ORAL_TABLET | Freq: Two times a day (BID) | ORAL | Status: DC
  Administered 2017-06-04 – 2017-06-05 (×2): 600 mg via ORAL
  Filled 2017-06-04 (×2): qty 1

## 2017-06-04 MED ORDER — ALBUTEROL (5 MG/ML) CONTINUOUS INHALATION SOLN
10.0000 mg/h | INHALATION_SOLUTION | RESPIRATORY_TRACT | Status: AC
Start: 2017-06-04 — End: 2017-06-04
  Administered 2017-06-04: 10 mg/h via RESPIRATORY_TRACT
  Filled 2017-06-04: qty 20

## 2017-06-04 MED ORDER — SIMVASTATIN 20 MG PO TABS
20.0000 mg | ORAL_TABLET | Freq: Every day | ORAL | Status: DC
  Administered 2017-06-04 – 2017-06-05 (×2): 20 mg via ORAL
  Filled 2017-06-04 (×2): qty 1

## 2017-06-04 NOTE — ED Notes (Signed)
Report given to [facility] on 300 on this time.

## 2017-06-04 NOTE — ED Notes (Signed)
Pt ambulated on RA to end of the hallway, O2 sat remained 97%. Pt's WOB increased and appeared dyspneic.

## 2017-06-04 NOTE — ED Provider Notes (Signed)
[facility] EMERGENCY DEPARTMENT Provider Note   CSN: [id number] Arrival date & time: 06/04/17  1125     History   Chief Complaint Chief Complaint  Patient presents with  . URI    HPI LYLITH BEBEAU is a 77 y.o. female.  HPI  The patient is a 77 year old female, she is a known diabetic, she also has high blood pressure, high cholesterol, she presents with a complaint of shortness of breath which has been waxing and waning since November with productive cough, lots of phlegm and in the last week is also developed some head congestion with nasal congestion drainage and a headache.  Her coughing and shortness of breath seems to have gotten worse prompting a visit to the urgent care on Wednesday, 2 days ago.  She was given Zithromax, Flonase, albuterol metered-dose inhaler and an antihistamine which she states is not giving her any relief.  The patient denies any prior history of COPD, she smoked only when she was a teenager, she has never had reactive airway disease.  She denies any fevers but does have some body aches and has had multiple sick contacts including flu, viral and pneumonia.  Past Medical History:  Diagnosis Date  . Diabetes ([place])    diet controlled since 2014  . Edema   . GERD (gastroesophageal reflux disease)   . High cholesterol   . Hypertension   . Migraine without aura, with intractable migraine, so stated, without mention of status migrainosus 08/31/2013  . Nephrolithiasis   . Other specified cardiac dysrhythmias(427.89)   . Vitiligo     Patient Active Problem List   Diagnosis Date Noted  . Acute bronchitis 06/04/2017  . Chest pain 01/07/2017  . Essential hypertension 01/07/2017  . Diabetes mellitus type 2 in obese ([place]) 01/07/2017  . Hyperlipidemia 01/07/2017  . Schatzki's ring   . Reflux esophagitis   . Hiatal hernia   . Diverticulosis of colon without hemorrhage   . History of colonic polyps   . Family history of colon cancer 05/09/2014  . Dysphagia,  pharyngoesophageal phase 05/09/2014  . Tubular adenoma of colon 05/09/2014  . Migraine without aura, with intractable migraine, so stated, without mention of status migrainosus 08/31/2013    Past Surgical History:  Procedure Laterality Date  . ABDOMINAL HYSTERECTOMY    . BACK SURGERY    . COLONOSCOPY  2011   Dr. Arnoldo Morale: sessile polyp transverse colon (tubular adenoma)   . COLONOSCOPY N/A 05/30/2014   Dr. Rourk:multiple colonic polyps/colonic diverticulosis/internal hemorrhoids. Tubular adenomas. Surveillance in 5 years if health permits.   . ESOPHAGOGASTRODUODENOSCOPY N/A 05/30/2014   Dr. Rourk:schatzki's ring with superimposed erosive reflux esophagitis, s/p dilation. hiatal hernia  . MALONEY DILATION N/A 05/30/2014   Procedure: Venia Minks DILATION;  Surgeon: Daneil Dolin, MD;  Location: [facility];  Service: Endoscopy;  Laterality: N/A;  . NASAL SINUS SURGERY    . TONSILLECTOMY      OB History    No data available       Home Medications    Prior to Admission medications   Medication Sig Start Date End Date Taking? Authorizing Provider  ALOE VERA PO Take 1 tablet by mouth daily.   Yes [provider]  aspirin EC 81 MG tablet Take 81 mg by mouth daily.   Yes [provider]  azithromycin (ZITHROMAX) 250 MG tablet Take 1 tablet by mouth daily. 06/02/17  Yes [provider]  Biotin 10000 MCG TABS Take 1 tablet by mouth daily.  Yes [provider]  hydrochlorothiazide (HYDRODIURIL) 25 MG tablet Take 25 mg by mouth daily. 08/15/13  Yes [provider]  ramipril (ALTACE) 10 MG capsule Take 2 capsules (20 mg total) by mouth daily. 01/08/17  Yes Isaac Bliss, Rayford Halsted, MD  simvastatin (ZOCOR) 20 MG tablet Take 20 mg by mouth daily.    Yes [provider]  clobetasol cream (TEMOVATE) 0.05 % Use lightly as directed for vulvar irritation. Do not use over 2 weeks without stopping Patient not taking: Reported on 06/04/2017 04/05/17    Jonnie Kind, MD    Family History Family History  Problem Relation Age of Onset  . Hypertension Mother   . Cancer - Colon Mother   . Hypertension Father   . Colon cancer Father     Social History Social History   Tobacco Use  . Smoking status: Never Smoker  . Smokeless tobacco: Never Used  Substance Use Topics  . Alcohol use: Yes    Alcohol/week: 0.0 oz    Comment: rare use  . Drug use: No     Allergies   Codeine   Review of Systems Review of Systems  All other systems reviewed and are negative.    Physical Exam Updated Vital Signs BP (!) 144/64   Pulse 100   Temp 99 F (37.2 C) (Oral)   Resp 17   Ht 5\' 2"  (1.575 m)   Wt 108.9 kg (240 lb)   SpO2 95%   BMI 43.90 kg/m   Physical Exam  Constitutional: She appears well-developed and well-nourished. She appears distressed.  HENT:  Head: Normocephalic and atraumatic.  Mouth/Throat: Oropharynx is clear and moist. No oropharyngeal exudate.  Eyes: Conjunctivae and EOM are normal. Pupils are equal, round, and reactive to light. Right eye exhibits no discharge. Left eye exhibits no discharge. No scleral icterus.  Neck: Normal range of motion. Neck supple. No JVD present. No thyromegaly present.  Cardiovascular: Normal rate, regular rhythm, normal heart sounds and intact distal pulses. Exam reveals no gallop and no friction rub.  No murmur heard. Pulmonary/Chest: She is in respiratory distress. She has wheezes. She has no rales.  Mild respiratory distress with mild increased work of breathing, tachypnea, prolonged expiratory phase, diffuse wheezes in all lung fields  Abdominal: Soft. Bowel sounds are normal. She exhibits no distension and no mass. There is no tenderness.  Musculoskeletal: Normal range of motion. She exhibits no edema or tenderness.  Lymphadenopathy:    She has no cervical adenopathy.  Neurological: She is alert. Coordination normal.  Skin: Skin is warm and dry. No rash noted. No erythema.    Psychiatric: She has a normal mood and affect. Her behavior is normal.  Nursing note and vitals reviewed.    ED Treatments / Results  Labs (all labs ordered are listed, but only abnormal results are displayed) Labs Reviewed  BASIC METABOLIC PANEL - Abnormal; Notable for the following components:      Result Value   Sodium 134 (*)    Potassium 3.3 (*)    Chloride 96 (*)    Glucose, Bld 227 (*)    Calcium 8.8 (*)    All other components within normal limits  CBC WITH DIFFERENTIAL/PLATELET  BRAIN NATRIURETIC PEPTIDE  TROPONIN I    EKG  EKG Interpretation  Date/Time:  Friday June 04 2017 12:04:24 EST Ventricular Rate:  85 PR Interval:    QRS Duration: 97 QT Interval:  356 QTC Calculation: 424 R Axis:   8 Text  Interpretation:  Sinus rhythm Ventricular premature complex Low voltage, precordial leads Consider anterior infarct since last tracing no significant change Confirmed by Noemi Chapel [phone]) on 06/04/2017 1:31:48 PM       Radiology Dg Chest Portable 1 View  Result Date: 06/04/2017 CLINICAL DATA:  Cough and congestion. EXAM: PORTABLE CHEST 1 VIEW COMPARISON:  01/07/2017. FINDINGS: Mediastinum hilar structures normal. Heart size normal. Mild bilateral interstitial prominence. Mild pneumonitis cannot be excluded. No pleural effusion or pneumothorax. IMPRESSION: Mild bilateral interstitial prominence. Mild pneumonitis cannot be excluded. Electronically Signed   By: Marcello Moores  Register   On: 06/04/2017 11:58    Procedures Procedures (including critical care time)  Medications Ordered in ED Medications  albuterol (PROVENTIL,VENTOLIN) solution continuous neb (0 mg/hr Nebulization Stopped 06/04/17 1359)     Initial Impression / Assessment and Plan / ED Course  I have reviewed the triage vital signs and the nursing notes.  Pertinent labs & imaging results that were available during my care of the patient were reviewed by me and considered in my medical decision making  (see chart for details).    The patient has some element of bronchitis with progressive wheezing and shortness of breath with ongoing cough.  Will obtain labs and x-ray to rule out pneumonia or other sources including congestive heart failure which could be causing the wheeze.  Patient is agreeable.  After continuous nebs, some improvement Still very dyspneic on ambulation CXR neg for infiltrate WBC  Normal Pt agreeable to admit. D/w Dr. Flonnie Overman Dhungel - will admit    Final Clinical Impressions(s) / ED Diagnoses   Final diagnoses:  Respiratory distress  Bronchitis     Noemi Chapel, MD 06/04/17 1416

## 2017-06-04 NOTE — H&P (Addendum)
TRH H&P   Patient Demographics:    Kimberly Miranda, is a 77 y.o. female  MRN: [id number]   DOB - 1940/07/09  Admit Date - 06/04/2017  Outpatient Primary MD for the patient is Asencion Noble, MD  Referring MD: Dr Sabra Heck  Outpatient Specialists: None  Patient coming from: home  Chief Complaint  Patient presents with  . URI      HPI:    Kimberly Miranda  is a 77 y.o. female, with history of diabetes, diet-controlled, GERD, hypertension, migraine headaches who presented to the ED with shortness of breath off and on for past 2 months associated with cough productive of whitish phlegm.  Since 1 week she was having nasal congestion with headaches, progressive cough and shortness of breath.  She went to an urgent care 2 days back since her PCP office was closed and was given albuterol inhaler, Flonase and Z-Pak which did not relieve her symptoms.  She denies any fevers, chills, but reports some body ache.  Denies nausea, vomiting, blurred vision, chest pain, (reports some chest tightness), nausea, vomiting, orthopnea, PND, abdominal pain, bowel or urinary symptoms.  Denies any sick contacts or recent travel.  Denies being on any new medications.  Course in the ED Vitals were stable except for being mildly tachypneic and O2 sat of 89% on room air.  Blood work showed sodium of 134, K of 3.3, chloride of 96 and glucose of 227.  Chest x-ray negative for infiltrate. Patient received an hour-long nebulizer with some relief and when ambulated became s extremely short of breath and wheezy.  Hospitalist consulted for observation overnight for acute bronchitis.   Review of systems:    In addition to the HPI above, (positive symptoms in bold) No Fever-chills, No Headache, No changes with Vision or hearing, No problems swallowing food or Liquids, No Chest pain, productive cough and shortness of breath,  wheezing No Abdominal pain, No Nausea or Vomiting, Bowel movements are regular, No Blood in stool or Urine, No dysuria, No new skin rashes or bruises, No new joints pains-aches,  No new weakness, tingling, numbness in any extremity, No recent weight gain or loss, No polyuria, polydypsia or polyphagia, No significant Mental Stressors.   With Past History of the following :    Past Medical History:  Diagnosis Date  . Diabetes ([facility])    diet controlled since 2014  . Edema   . GERD (gastroesophageal reflux disease)   . High cholesterol   . Hypertension   . Migraine without aura, with intractable migraine, so stated, without mention of status migrainosus 08/31/2013  . Nephrolithiasis   . Other specified cardiac dysrhythmias(427.89)   . Vitiligo       Past Surgical History:  Procedure Laterality Date  . ABDOMINAL HYSTERECTOMY    . BACK SURGERY    . COLONOSCOPY  2011   Dr. Arnoldo Morale: sessile polyp  transverse colon (tubular adenoma)   . COLONOSCOPY N/A 05/30/2014   Dr. Rourk:multiple colonic polyps/colonic diverticulosis/internal hemorrhoids. Tubular adenomas. Surveillance in 5 years if health permits.   . ESOPHAGOGASTRODUODENOSCOPY N/A 05/30/2014   Dr. Rourk:schatzki's ring with superimposed erosive reflux esophagitis, s/p dilation. hiatal hernia  . MALONEY DILATION N/A 05/30/2014   Procedure: Venia Minks DILATION;  Surgeon: Daneil Dolin, MD;  Location: [facility];  Service: Endoscopy;  Laterality: N/A;  . NASAL SINUS SURGERY    . TONSILLECTOMY        Social History:     Social History   Tobacco Use  . Smoking status: Never Smoker  . Smokeless tobacco: Never Used  Substance Use Topics  . Alcohol use: Yes    Alcohol/week: 0.0 oz    Comment: rare use     Lives -home alone  Mobility -independent     Family History :     Family History  Problem Relation Age of Onset  . Hypertension Mother   . Cancer - Colon Mother   . Hypertension Father   . Colon cancer  Father       Home Medications:   Prior to Admission medications   Medication Sig Start Date End Date Taking? Authorizing Provider  ALOE VERA PO Take 1 tablet by mouth daily.   Yes [provider]  aspirin EC 81 MG tablet Take 81 mg by mouth daily.   Yes [provider]  azithromycin (ZITHROMAX) 250 MG tablet Take 1 tablet by mouth daily. 06/02/17  Yes [provider]  Biotin 10000 MCG TABS Take 1 tablet by mouth daily.   Yes [provider]  hydrochlorothiazide (HYDRODIURIL) 25 MG tablet Take 25 mg by mouth daily. 08/15/13  Yes [provider]  ramipril (ALTACE) 10 MG capsule Take 2 capsules (20 mg total) by mouth daily. 01/08/17  Yes Isaac Bliss, Rayford Halsted, MD  simvastatin (ZOCOR) 20 MG tablet Take 20 mg by mouth daily.    Yes [provider]  clobetasol cream (TEMOVATE) 0.05 % Use lightly as directed for vulvar irritation. Do not use over 2 weeks without stopping Patient not taking: Reported on 06/04/2017 04/05/17   Jonnie Kind, MD     Allergies:     Allergies  Allergen Reactions  . Codeine Other (See Comments)    Stomach upset     Physical Exam:   Vitals  Blood pressure (!) 144/64, pulse 100, temperature 99 F (37.2 C), temperature source Oral, resp. rate 17, height 5\' 2"  (1.575 m), weight 108.9 kg (240 lb), SpO2 95 %.   General: Elderly obese female not in distress, appears fatigued HEENT: Pupils reactive bilaterally, no pallor, no icterus, moist oral mucosa, supple neck Chest: Diffuse wheezing bilaterally, no crackles CVS: Normal S1 and S2, no murmurs rubs or gallop GI: Soft, nondistended, nontender, bowel sounds present Musculoskeletal: Warm, no edema CNS: Alert and oriented    Data Review:    CBC Recent Labs  Lab 06/04/17 1149  WBC 5.6  HGB 14.5  HCT 43.3  PLT 204  MCV 89.1  MCH 29.8  MCHC 33.5  RDW 14.3  LYMPHSABS 1.2  MONOABS 0.8  EOSABS 0.1  BASOSABS 0.0    ------------------------------------------------------------------------------------------------------------------  Chemistries  Recent Labs  Lab 06/04/17 1149  NA 134*  K 3.3*  CL 96*  CO2 25  GLUCOSE 227*  BUN 16  CREATININE 0.84  CALCIUM 8.8*   ------------------------------------------------------------------------------------------------------------------ estimated creatinine clearance is 64.1 mL/min (by C-G formula based on SCr of  0.84 mg/dL). ------------------------------------------------------------------------------------------------------------------ No results for input(s): TSH, T4TOTAL, T3FREE, THYROIDAB in the last 72 hours.  Invalid input(s): FREET3  Coagulation profile No results for input(s): INR, PROTIME in the last 168 hours. ------------------------------------------------------------------------------------------------------------------- No results for input(s): DDIMER in the last 72 hours. -------------------------------------------------------------------------------------------------------------------  Cardiac Enzymes Recent Labs  Lab 06/04/17 1158  TROPONINI <0.03   ------------------------------------------------------------------------------------------------------------------    Component Value Date/Time   BNP 56.0 06/04/2017 1149     ---------------------------------------------------------------------------------------------------------------  Urinalysis    Component Value Date/Time   COLORURINE YELLOW 08/24/2013 1920   APPEARANCEUR CLEAR 08/24/2013 1920   LABSPEC 1.010 08/24/2013 1920   PHURINE 6.0 08/24/2013 1920   GLUCOSEU NEGATIVE 08/24/2013 1920   HGBUR NEGATIVE 08/24/2013 1920   BILIRUBINUR NEGATIVE 08/24/2013 1920   KETONESUR NEGATIVE 08/24/2013 1920   PROTEINUR NEGATIVE 08/24/2013 1920   UROBILINOGEN 0.2 08/24/2013 1920   NITRITE NEGATIVE 08/24/2013 1920   LEUKOCYTESUR MODERATE (A) 08/24/2013 1920     ----------------------------------------------------------------------------------------------------------------   Imaging Results:    Dg Chest Portable 1 View  Result Date: 06/04/2017 CLINICAL DATA:  Cough and congestion. EXAM: PORTABLE CHEST 1 VIEW COMPARISON:  01/07/2017. FINDINGS: Mediastinum hilar structures normal. Heart size normal. Mild bilateral interstitial prominence. Mild pneumonitis cannot be excluded. No pleural effusion or pneumothorax. IMPRESSION: Mild bilateral interstitial prominence. Mild pneumonitis cannot be excluded. Electronically Signed   By: Marcello Moores  Register   On: 06/04/2017 11:58    My personal review of EKG: Normal sinus rhythm at 85 with PVCs.  No ST-T changes.   Assessment & Plan:    Principal Problem:   Acute bronchitis Placed on observation.  IV Solu-Medrol 40 mg every 12 hours, scheduled albuterol neb every 4 hours.  Empiric oral doxycycline.  Supportive care with Tylenol and antitussives. O2 sat stable on room air.  Monitor overnight and if stable can be discharged home tomorrow.  Active Problems: Hypokalemia Replenished  Essential hypertension Continue HCTZ and ramipril  Hyperlipidemia Continue statin         DVT Prophylaxis   Lovenox -   AM Labs Ordered, also please review Full Orders  Family Communication: Admission, patients condition and plan of care including tests being ordered have been discussed with the patient and family at bedside  Code Status full code  Likely DC to home tomorrow  Condition: [place] called: None  Admission status: Observation  Time spent in minutes : 50   Haruko Mersch M.D on 06/04/2017 at 3:01 PM  Between 7am to 7pm - Pager - [phone]. After 7pm go to [email or web address] - password [email or web address]  [facility] - Office  [phone]

## 2017-06-04 NOTE — ED Triage Notes (Signed)
Pt reports diagnosed with URI Wednesday and given zpack, proair inhaler, flonase, and zyrtec.  Reports is no better.  Says breathing is worse and is vomiting.  Reports has had these symptoms off and on since Nov.

## 2017-06-05 DIAGNOSIS — E1169 Type 2 diabetes mellitus with other specified complication: Secondary | ICD-10-CM

## 2017-06-05 DIAGNOSIS — E669 Obesity, unspecified: Secondary | ICD-10-CM

## 2017-06-05 DIAGNOSIS — K210 Gastro-esophageal reflux disease with esophagitis: Secondary | ICD-10-CM | POA: Diagnosis not present

## 2017-06-05 DIAGNOSIS — J209 Acute bronchitis, unspecified: Secondary | ICD-10-CM | POA: Diagnosis not present

## 2017-06-05 LAB — BASIC METABOLIC PANEL
ANION GAP: 13 (ref 5–15)
BUN: 20 mg/dL (ref 6–20)
CO2: 25 mmol/L (ref 22–32)
Calcium: 9.1 mg/dL (ref 8.9–10.3)
Chloride: 98 mmol/L — ABNORMAL LOW (ref 101–111)
Creatinine, Ser: 0.78 mg/dL (ref 0.44–1.00)
GFR calc Af Amer: >60 mL/min (ref >60)
Glucose, Bld: 256 mg/dL — ABNORMAL HIGH (ref 65–99)
POTASSIUM: 4.1 mmol/L (ref 3.5–5.1)
SODIUM: 136 mmol/L (ref 135–145)

## 2017-06-05 LAB — GLUCOSE, CAPILLARY
GLUCOSE-CAPILLARY: 305 mg/dL — AB (ref 65–99)
Glucose-Capillary: 266 mg/dL — ABNORMAL HIGH (ref 65–99)
Glucose-Capillary: 229 mg/dL — ABNORMAL HIGH (ref 65–99)

## 2017-06-05 MED ORDER — PANTOPRAZOLE SODIUM 40 MG PO TBEC: 40.0000 mg | DELAYED_RELEASE_TABLET | Freq: Every day | ORAL | 0 refills | Status: DC

## 2017-06-05 MED ORDER — DOXYCYCLINE HYCLATE 100 MG PO TABS: 100.0000 mg | ORAL_TABLET | Freq: Two times a day (BID) | ORAL | 0 refills | Status: AC

## 2017-06-05 MED ORDER — GUAIFENESIN 100 MG/5ML PO SOLN: 5.0000 mL | ORAL | 0 refills | Status: DC | PRN

## 2017-06-05 MED ORDER — GUAIFENESIN ER 600 MG PO TB12: 600.0000 mg | ORAL_TABLET | Freq: Two times a day (BID) | ORAL | 0 refills | Status: DC

## 2017-06-05 MED ORDER — ALBUTEROL SULFATE (2.5 MG/3ML) 0.083% IN NEBU: 2.5000 mg | INHALATION_SOLUTION | Freq: Four times a day (QID) | RESPIRATORY_TRACT | 0 refills | Status: DC | PRN

## 2017-06-05 MED ORDER — PREDNISONE 20 MG PO TABS: 40.0000 mg | ORAL_TABLET | Freq: Every day | ORAL | 0 refills | Status: AC

## 2017-06-05 NOTE — Discharge Instructions (Signed)

## 2017-06-05 NOTE — Discharge Summary (Signed)
Physician Discharge Summary  Kimberly PAMINTUAN DTO:[id number] DOB: 1940-07-01 DOA: 06/04/2017  PCP: Asencion Noble, MD  Admit date: 06/04/2017 Discharge date: 06/05/2017  Admitted From: Home Disposition: Home  Recommendations for Outpatient Follow-up:  1. Follow up with PCP in 1-2 weeks 2. Patient will be discharged on oral prednisone and doxycycline for a 5-day course.  (Until 2/7) 3. Patient being discharged on 2 weeks course of Protonix for possible reflux symptoms.  Please refer her to outpatient GI if symptoms unimproved.   Home Health: None Equipment/Devices none  Discharge Condition: Fair CODE STATUS: Code Diet recommendation: Carb modified     Discharge Diagnoses:  Principal Problem:   Acute bronchitis  Active Problems:   Intractable migraine without aura   Reflux esophagitis   Diabetes mellitus type 2 in obese (HCC) Hypokalemia  Brief narrative/HPI 77 y.o. female, with history of diabetes, diet-controlled, GERD, hypertension, migraine headaches who presented to the ED with shortness of breath off and on for past 2 months associated with cough productive of whitish phlegm.  Since 1 week she was having nasal congestion with headaches, progressive cough and shortness of breath.  She went to an urgent care 2 days back since her PCP office was closed and was given albuterol inhaler, Flonase and Z-Pak which did not relieve her symptoms.  She denies any fevers, chills, but reports some body ache.  Denies nausea, vomiting, blurred vision, chest pain, (reports some chest tightness), nausea, vomiting, orthopnea, PND, abdominal pain, bowel or urinary symptoms.  Denies any sick contacts or recent travel.  Denies being on any new medications.  Course in the ED Vitals were stable except for being mildly tachypneic and O2 sat of 89% on room air.  Blood work showed sodium of 134, K of 3.3, chloride of 96 and glucose of 227.  Chest x-ray negative for infiltrate. Patient received an hour-long  nebulizer with some relief and when ambulated became s extremely short of breath and wheezy.  Hospitalist consulted for observation overnight for acute bronchitis.   Hospital course   Principal Problem:   Acute bronchitis Placed on observation.  Given IV Solu-Medrol, albuterol nebs and empiric doxycycline.  Supportive care with Tylenol and antitussives.  Sats remained stable on room air.  Patient feels better this morning and received 2 rounds of nebulizer prior to discharge. She will be discharged on oral prednisone and oral doxycycline for 5 more days along with antitussives.  Patient reports that she has her husband's nebulizer machine which she can use.  I have written Rx for duration prescription for albuterol neb for as needed use. Follow-up with PCP in 1 week  Active Problems: GERD Patient reports having heartburn symptoms and occasional dysphagia for several weeks.  I am discharging her on a 2 weeks course of oral Protonix daily.  She would likely benefit from outpatient referral to GI.  Hypokalemia Replenished  Essential hypertension Continue HCTZ and ramipril  Hyperlipidemia Continue statin  Diabetes mellitus type II On metformin at home which she should continue.  CBC elevated with steroid.  Family communication: Family at bedside Disposition: Home    Discharge Instructions   Allergies as of 06/05/2017      Reactions   Codeine Other (See Comments)   Stomach upset      Medication List    STOP taking these medications   azithromycin 250 MG tablet Commonly known as:  ZITHROMAX   clobetasol cream 0.05 % Commonly known as:  TEMOVATE     TAKE these medications  albuterol (2.5 MG/3ML) 0.083% nebulizer solution Commonly known as:  PROVENTIL Take 3 mLs (2.5 mg total) by nebulization every 6 (six) hours as needed for up to 5 days for wheezing or shortness of breath.   ALOE VERA PO Take 1 tablet by mouth daily.   aspirin EC 81 MG tablet Take 81 mg by  mouth daily.   Biotin 10000 MCG Tabs Take 1 tablet by mouth daily.   doxycycline 100 MG tablet Commonly known as:  VIBRA-TABS Take 1 tablet (100 mg total) by mouth every 12 (twelve) hours for 5 days.   guaiFENesin 100 MG/5ML Soln Commonly known as:  ROBITUSSIN Take 5 mLs (100 mg total) by mouth every 4 (four) hours as needed for cough or to loosen phlegm.   guaiFENesin 600 MG 12 hr tablet Commonly known as:  MUCINEX Take 1 tablet (600 mg total) by mouth 2 (two) times daily.   hydrochlorothiazide 25 MG tablet Commonly known as:  HYDRODIURIL Take 25 mg by mouth daily.   pantoprazole 40 MG tablet Commonly known as:  PROTONIX Take 1 tablet (40 mg total) by mouth daily for 14 days.   predniSONE 20 MG tablet Commonly known as:  DELTASONE Take 2 tablets (40 mg total) by mouth daily with breakfast for 5 days.   ramipril 10 MG capsule Commonly known as:  ALTACE Take 2 capsules (20 mg total) by mouth daily.   simvastatin 20 MG tablet Commonly known as:  ZOCOR Take 20 mg by mouth daily.      Follow-up Information    Asencion Noble, MD. Schedule an appointment as soon as possible for a visit in 1 week(s).   Specialty:  Internal Medicine Contact information: [address] [place] [address] [phone]          Allergies  Allergen Reactions  . Codeine Other (See Comments)    Stomach upset     Procedures/Studies: Dg Chest Portable 1 View  Result Date: 06/04/2017 CLINICAL DATA:  Cough and congestion. EXAM: PORTABLE CHEST 1 VIEW COMPARISON:  01/07/2017. FINDINGS: Mediastinum hilar structures normal. Heart size normal. Mild bilateral interstitial prominence. Mild pneumonitis cannot be excluded. No pleural effusion or pneumothorax. IMPRESSION: Mild bilateral interstitial prominence. Mild pneumonitis cannot be excluded. Electronically Signed   By: Marcello Moores  Register   On: 06/04/2017 11:58       Subjective: Reports breathing and cough to be better.  Discharge  Exam: Vitals:   06/05/17 0204 06/05/17 0819  BP:    Pulse:    Resp:    Temp:    SpO2: 93% 95%   Vitals:   06/04/17 2058 06/04/17 2206 06/05/17 0204 06/05/17 0819  BP:  118/62    Pulse:  82    Resp:  20    Temp:  98.4 F (36.9 C)    TempSrc:  Oral    SpO2: 98% 96% 93% 95%  Weight:      Height:        General: Elderly obese female not in distress HEENT: Moist mucosa, supple neck Chest: Scattered rhonchi bilaterally CVS: Normal S1 and S2, no murmurs r GI: Soft, nondistended, nontender Musculoskeletal: Warm, no edema     The results of significant diagnostics from this hospitalization (including imaging, microbiology, ancillary and laboratory) are listed below for reference.     Microbiology: No results found for this or any previous visit (from the past 240 hour(s)).   Labs: BNP (last 3 results) Recent Labs    06/04/17 1149  BNP 56.0  Basic Metabolic Panel: Recent Labs  Lab 06/04/17 1149 06/05/17 0831  NA 134* 136  K 3.3* 4.1  CL 96* 98*  CO2 25 25  GLUCOSE 227* 256*  BUN 16 20  CREATININE 0.84 0.78  CALCIUM 8.8* 9.1   Liver Function Tests: No results for input(s): AST, ALT, ALKPHOS, BILITOT, PROT, ALBUMIN in the last 168 hours. No results for input(s): LIPASE, AMYLASE in the last 168 hours. No results for input(s): AMMONIA in the last 168 hours. CBC: Recent Labs  Lab 06/04/17 1149  WBC 5.6  NEUTROABS 3.6  HGB 14.5  HCT 43.3  MCV 89.1  PLT 204   Cardiac Enzymes: Recent Labs  Lab 06/04/17 1158  TROPONINI <0.03   BNP: Invalid input(s): POCBNP CBG: Recent Labs  Lab 06/04/17 1714 06/04/17 2204 06/05/17 0156 06/05/17 0734  GLUCAP 192* 354* 305* 266*   D-Dimer No results for input(s): DDIMER in the last 72 hours. Hgb A1c No results for input(s): HGBA1C in the last 72 hours. Lipid Profile No results for input(s): CHOL, HDL, LDLCALC, TRIG, CHOLHDL, LDLDIRECT in the last 72 hours. Thyroid function studies No results for input(s):  TSH, T4TOTAL, T3FREE, THYROIDAB in the last 72 hours.  Invalid input(s): FREET3 Anemia work up No results for input(s): VITAMINB12, FOLATE, FERRITIN, TIBC, IRON, RETICCTPCT in the last 72 hours. Urinalysis    Component Value Date/Time   COLORURINE YELLOW 08/24/2013 1920   APPEARANCEUR CLEAR 08/24/2013 1920   LABSPEC 1.010 08/24/2013 1920   PHURINE 6.0 08/24/2013 1920   GLUCOSEU NEGATIVE 08/24/2013 1920   HGBUR NEGATIVE 08/24/2013 1920   BILIRUBINUR NEGATIVE 08/24/2013 1920   KETONESUR NEGATIVE 08/24/2013 1920   PROTEINUR NEGATIVE 08/24/2013 1920   UROBILINOGEN 0.2 08/24/2013 1920   NITRITE NEGATIVE 08/24/2013 1920   LEUKOCYTESUR MODERATE (A) 08/24/2013 1920   Sepsis Labs Invalid input(s): PROCALCITONIN,  WBC,  LACTICIDVEN Microbiology No results found for this or any previous visit (from the past 240 hour(s)).   Time coordinating discharge: < 30 minutes  SIGNED:   Louellen Molder, MD  [facility] 06/05/2017, 10:34 AM Pager   If 7PM-7AM, please contact night-coverage [email or web address] Password [email or web address]

## 2017-06-05 NOTE — Progress Notes (Signed)
Removed IV, patient tolerated well, 2x2 gauze and paper tape applied to site, reviewed AVS with patient and patient's daughter, both verbalized understanding.  Patient transported home by daughter.

## 2017-06-10 DIAGNOSIS — J209 Acute bronchitis, unspecified: Secondary | ICD-10-CM | POA: Diagnosis not present

## 2017-09-02 DIAGNOSIS — H8110 Benign paroxysmal vertigo, unspecified ear: Secondary | ICD-10-CM | POA: Diagnosis not present

## 2017-11-17 ENCOUNTER — Other Ambulatory Visit: Payer: Self-pay | Admitting: Obstetrics and Gynecology

## 2017-12-24 DIAGNOSIS — G43909 Migraine, unspecified, not intractable, without status migrainosus: Secondary | ICD-10-CM | POA: Diagnosis not present

## 2017-12-24 DIAGNOSIS — E1129 Type 2 diabetes mellitus with other diabetic kidney complication: Secondary | ICD-10-CM | POA: Diagnosis not present

## 2017-12-24 DIAGNOSIS — E785 Hyperlipidemia, unspecified: Secondary | ICD-10-CM | POA: Diagnosis not present

## 2017-12-24 DIAGNOSIS — I10 Essential (primary) hypertension: Secondary | ICD-10-CM | POA: Diagnosis not present

## 2017-12-24 DIAGNOSIS — Z79899 Other long term (current) drug therapy: Secondary | ICD-10-CM | POA: Diagnosis not present

## 2017-12-24 DIAGNOSIS — R609 Edema, unspecified: Secondary | ICD-10-CM | POA: Diagnosis not present

## 2017-12-31 DIAGNOSIS — I491 Atrial premature depolarization: Secondary | ICD-10-CM | POA: Diagnosis not present

## 2017-12-31 DIAGNOSIS — E1129 Type 2 diabetes mellitus with other diabetic kidney complication: Secondary | ICD-10-CM | POA: Diagnosis not present

## 2017-12-31 DIAGNOSIS — E785 Hyperlipidemia, unspecified: Secondary | ICD-10-CM | POA: Diagnosis not present

## 2017-12-31 DIAGNOSIS — Z6841 Body Mass Index (BMI) 40.0 and over, adult: Secondary | ICD-10-CM | POA: Diagnosis not present

## 2017-12-31 DIAGNOSIS — I10 Essential (primary) hypertension: Secondary | ICD-10-CM | POA: Diagnosis not present

## 2018-01-20 DIAGNOSIS — E119 Type 2 diabetes mellitus without complications: Secondary | ICD-10-CM | POA: Diagnosis not present

## 2018-02-18 IMAGING — MG MM CLIP PLACEMENT
2 series · 2 of 2 positions shown · non-contrast
Comparison: Previous exam(s).

CLINICAL DATA: Evaluate clip placement following stereotactic left
breast biopsy.

EXAM:
DIAGNOSTIC LEFT MAMMOGRAM POST STEREOTACTIC BIOPSY

[L (1 of 2)]
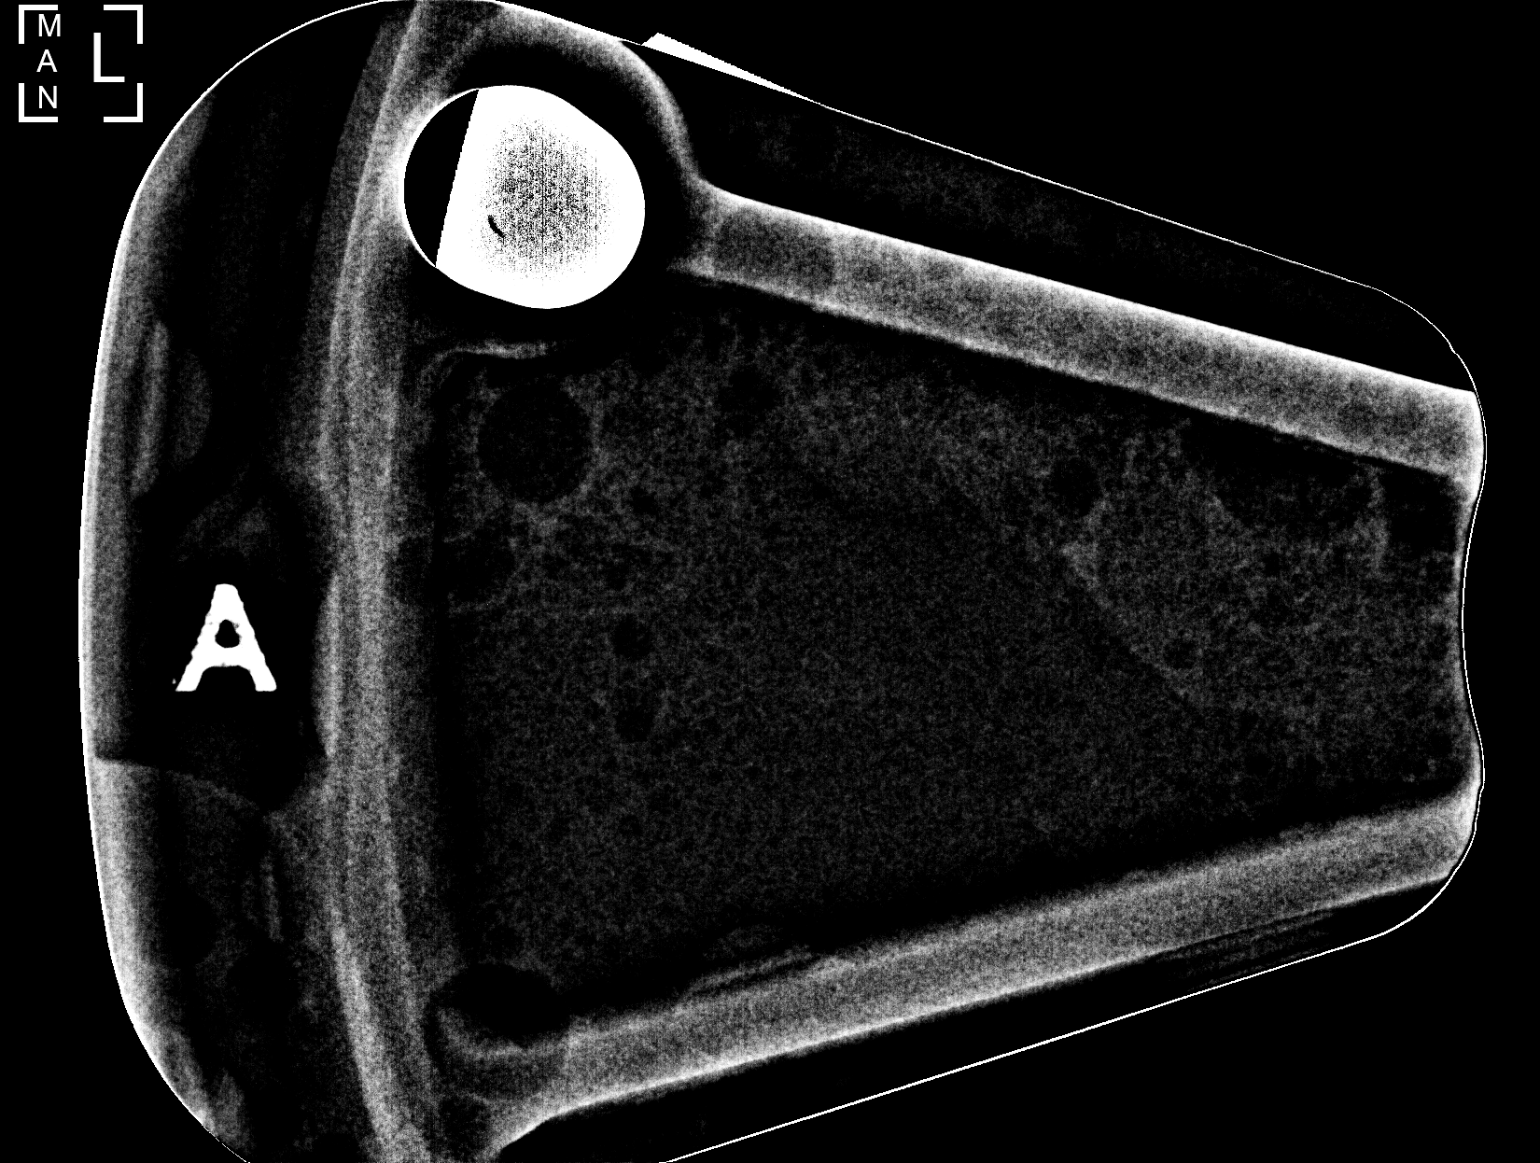

[L (2 of 2)]
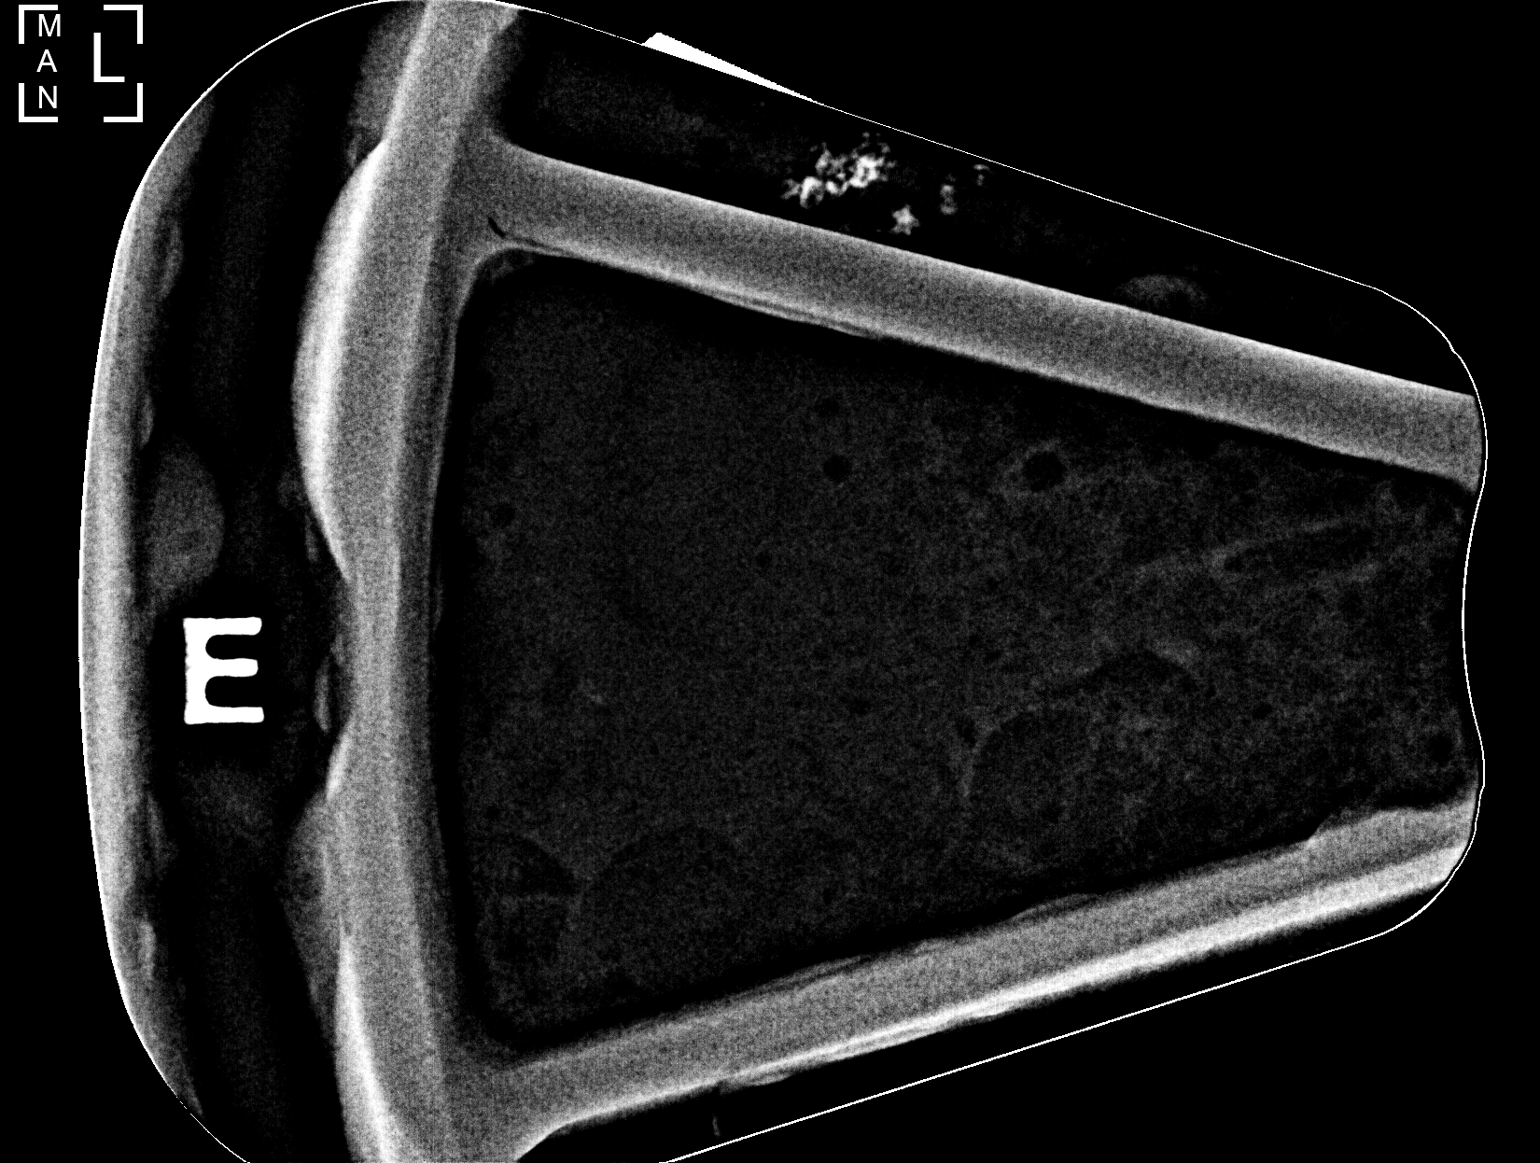

[2 of 2 positions shown; findings below may reference images not displayed]

FINDINGS: Mammographic images were obtained following stereotactic guided
biopsy of calcifications within the lower outer left breast.

The coil shaped clip is in satisfactory position.
IMPRESSION: Satisfactory position of coil shaped biopsy clip following left
breast stereotactic biopsy.

Final Assessment: Post Procedure Mammograms for Marker Placement

## 2018-03-11 DIAGNOSIS — E1129 Type 2 diabetes mellitus with other diabetic kidney complication: Secondary | ICD-10-CM | POA: Diagnosis not present

## 2018-03-18 DIAGNOSIS — Z23 Encounter for immunization: Secondary | ICD-10-CM | POA: Diagnosis not present

## 2018-03-18 DIAGNOSIS — E1129 Type 2 diabetes mellitus with other diabetic kidney complication: Secondary | ICD-10-CM | POA: Diagnosis not present

## 2018-04-20 ENCOUNTER — Other Ambulatory Visit: Payer: Self-pay | Admitting: Obstetrics and Gynecology

## 2018-04-20 DIAGNOSIS — Z1231 Encounter for screening mammogram for malignant neoplasm of breast: Secondary | ICD-10-CM

## 2018-05-02 ENCOUNTER — Other Ambulatory Visit: Payer: Self-pay | Admitting: Obstetrics and Gynecology

## 2018-05-16 ENCOUNTER — Ambulatory Visit (HOSPITAL_COMMUNITY)
Admission: RE | Admit: 2018-05-16 | Discharge: 2018-05-16 | Disposition: A | Discharge status: Home / Self Care | Payer: Medicare Other | Source: Ambulatory Visit | Attending: Obstetrics and Gynecology | Admitting: Obstetrics and Gynecology

## 2018-05-16 DIAGNOSIS — Z1231 Encounter for screening mammogram for malignant neoplasm of breast: Secondary | ICD-10-CM | POA: Insufficient documentation

## 2018-06-17 DIAGNOSIS — H01111 Allergic dermatitis of right upper eyelid: Secondary | ICD-10-CM | POA: Diagnosis not present

## 2018-06-17 DIAGNOSIS — H01114 Allergic dermatitis of left upper eyelid: Secondary | ICD-10-CM | POA: Diagnosis not present

## 2018-06-17 DIAGNOSIS — H4322 Crystalline deposits in vitreous body, left eye: Secondary | ICD-10-CM | POA: Diagnosis not present

## 2018-06-17 DIAGNOSIS — H40023 Open angle with borderline findings, high risk, bilateral: Secondary | ICD-10-CM | POA: Diagnosis not present

## 2018-07-12 DIAGNOSIS — E1129 Type 2 diabetes mellitus with other diabetic kidney complication: Secondary | ICD-10-CM | POA: Diagnosis not present

## 2018-07-19 DIAGNOSIS — E1129 Type 2 diabetes mellitus with other diabetic kidney complication: Secondary | ICD-10-CM | POA: Diagnosis not present

## 2018-07-19 DIAGNOSIS — Z6841 Body Mass Index (BMI) 40.0 and over, adult: Secondary | ICD-10-CM | POA: Diagnosis not present

## 2018-11-30 DIAGNOSIS — N390 Urinary tract infection, site not specified: Secondary | ICD-10-CM | POA: Diagnosis not present

## 2019-01-20 DIAGNOSIS — G43909 Migraine, unspecified, not intractable, without status migrainosus: Secondary | ICD-10-CM | POA: Diagnosis not present

## 2019-01-20 DIAGNOSIS — E1129 Type 2 diabetes mellitus with other diabetic kidney complication: Secondary | ICD-10-CM | POA: Diagnosis not present

## 2019-01-20 DIAGNOSIS — E785 Hyperlipidemia, unspecified: Secondary | ICD-10-CM | POA: Diagnosis not present

## 2019-01-20 DIAGNOSIS — H409 Unspecified glaucoma: Secondary | ICD-10-CM | POA: Diagnosis not present

## 2019-01-20 DIAGNOSIS — I10 Essential (primary) hypertension: Secondary | ICD-10-CM | POA: Diagnosis not present

## 2019-01-20 DIAGNOSIS — R600 Localized edema: Secondary | ICD-10-CM | POA: Diagnosis not present

## 2019-01-20 DIAGNOSIS — Z79899 Other long term (current) drug therapy: Secondary | ICD-10-CM | POA: Diagnosis not present

## 2019-01-27 DIAGNOSIS — E785 Hyperlipidemia, unspecified: Secondary | ICD-10-CM | POA: Diagnosis not present

## 2019-01-27 DIAGNOSIS — N390 Urinary tract infection, site not specified: Secondary | ICD-10-CM | POA: Diagnosis not present

## 2019-01-27 DIAGNOSIS — Z23 Encounter for immunization: Secondary | ICD-10-CM | POA: Diagnosis not present

## 2019-01-27 DIAGNOSIS — E1129 Type 2 diabetes mellitus with other diabetic kidney complication: Secondary | ICD-10-CM | POA: Diagnosis not present

## 2019-01-27 DIAGNOSIS — I4901 Ventricular fibrillation: Secondary | ICD-10-CM | POA: Diagnosis not present

## 2019-02-16 ENCOUNTER — Encounter (HOSPITAL_COMMUNITY): Payer: Self-pay | Admitting: Emergency Medicine

## 2019-02-16 ENCOUNTER — Other Ambulatory Visit: Payer: Self-pay

## 2019-02-16 ENCOUNTER — Emergency Department (HOSPITAL_COMMUNITY): Payer: Medicare Other

## 2019-02-16 ENCOUNTER — Emergency Department (HOSPITAL_COMMUNITY)
Admission: EM | Admit: 2019-02-16 | Discharge: 2019-02-16 | Disposition: A | Discharge status: Home / Self Care | Payer: Medicare Other | Attending: Emergency Medicine | Admitting: Emergency Medicine

## 2019-02-16 DIAGNOSIS — Z79899 Other long term (current) drug therapy: Secondary | ICD-10-CM | POA: Diagnosis not present

## 2019-02-16 DIAGNOSIS — I10 Essential (primary) hypertension: Secondary | ICD-10-CM | POA: Diagnosis not present

## 2019-02-16 DIAGNOSIS — E119 Type 2 diabetes mellitus without complications: Secondary | ICD-10-CM | POA: Diagnosis not present

## 2019-02-16 DIAGNOSIS — Z7982 Long term (current) use of aspirin: Secondary | ICD-10-CM | POA: Diagnosis not present

## 2019-02-16 DIAGNOSIS — R059 Cough, unspecified: Secondary | ICD-10-CM

## 2019-02-16 DIAGNOSIS — R05 Cough: Secondary | ICD-10-CM

## 2019-02-16 DIAGNOSIS — J069 Acute upper respiratory infection, unspecified: Secondary | ICD-10-CM | POA: Diagnosis not present

## 2019-02-16 DIAGNOSIS — Z20828 Contact with and (suspected) exposure to other viral communicable diseases: Secondary | ICD-10-CM | POA: Insufficient documentation

## 2019-02-16 MED ORDER — DEXAMETHASONE SODIUM PHOSPHATE 10 MG/ML IJ SOLN
8.0000 mg | Freq: Once | INTRAMUSCULAR | Status: AC
  Administered 2019-02-16: 8 mg via INTRAMUSCULAR
  Filled 2019-02-16: qty 1

## 2019-02-16 NOTE — ED Triage Notes (Signed)
Congestion for 2 weeks,  Seen by PCP and treated with z-pack.  C/o weakness and productive cough (light yellow).  Denies any pain at this time.

## 2019-02-16 NOTE — Discharge Instructions (Addendum)
Chest x-ray was negative for pneumonia.  You have been given an intramuscular injection of steroids in the ED.  This should help you feel better.  Increase fluids.  Tylenol.  No new antibiotics at this time.  Covid test pending.

## 2019-02-16 NOTE — ED Provider Notes (Signed)
[facility] EMERGENCY DEPARTMENT Provider Note   CSN: [id number] Arrival date & time: 02/16/19  1416     History   Chief Complaint Chief Complaint  Patient presents with  . Nasal Congestion    HPI Kimberly Miranda is a 78 y.o. female with past medical history significant for type 2 diabetes, HLD, HTN, and GERD who presents to the ED with a 2-week history of congestion.  She had been evaluated by her PCP approximately 8 days ago and treated with a Z-Pak for a sinusitis.  She reports that her congestion subsequently moved down into her chest and after failing to improve with her Z-Pak, her PCP started her on Augmentin 2 days ago.  Her daughter recently purchased a pulse oximeter and states that she was saturating in the 80s which prompted her to bring her to the ED today.  Patient complains of cough productive of yellow sputum as well as fatigue, mildly sore throat, and diminished appetite.  She denies any headache, fever, chills, dizziness, sick contacts, rhinorrhea, chest pain, difficulty breathing, abdominal pain, nausea, vomiting, loss of smell or taste, or changes in urinary or bowel habits.  In addition to her Augmentin, she has been taking Mucinex and Delsym with some relief and she is able to sleep at night.     HPI  Past Medical History:  Diagnosis Date  . Diabetes ([place])    diet controlled since 2014  . Edema   . GERD (gastroesophageal reflux disease)   . High cholesterol   . Hypertension   . Migraine without aura, with intractable migraine, so stated, without mention of status migrainosus 08/31/2013  . Nephrolithiasis   . Other specified cardiac dysrhythmias(427.89)   . Vitiligo     Patient Active Problem List   Diagnosis Date Noted  . Acute bronchitis 06/04/2017  . Respiratory distress   . Hypokalemia   . Chest pain 01/07/2017  . Essential hypertension 01/07/2017  . Diabetes mellitus type 2 in obese ([place]) 01/07/2017  . Hyperlipidemia 01/07/2017  . Schatzki's ring    . Reflux esophagitis   . Hiatal hernia   . Diverticulosis of colon without hemorrhage   . History of colonic polyps   . Family history of colon cancer 05/09/2014  . Dysphagia, pharyngoesophageal phase 05/09/2014  . Tubular adenoma of colon 05/09/2014  . Intractable migraine without aura 08/31/2013    Past Surgical History:  Procedure Laterality Date  . ABDOMINAL HYSTERECTOMY    . BACK SURGERY    . COLONOSCOPY  2011   Dr. Arnoldo Morale: sessile polyp transverse colon (tubular adenoma)   . COLONOSCOPY N/A 05/30/2014   Dr. Rourk:multiple colonic polyps/colonic diverticulosis/internal hemorrhoids. Tubular adenomas. Surveillance in 5 years if health permits.   . ESOPHAGOGASTRODUODENOSCOPY N/A 05/30/2014   Dr. Rourk:schatzki's ring with superimposed erosive reflux esophagitis, s/p dilation. hiatal hernia  . MALONEY DILATION N/A 05/30/2014   Procedure: Venia Minks DILATION;  Surgeon: Daneil Dolin, MD;  Location: [facility];  Service: Endoscopy;  Laterality: N/A;  . NASAL SINUS SURGERY    . TONSILLECTOMY       OB History   No obstetric history on file.      Home Medications    Prior to Admission medications   Medication Sig Start Date End Date Taking? Authorizing Provider  albuterol (PROVENTIL) (2.5 MG/3ML) 0.083% nebulizer solution Take 3 mLs (2.5 mg total) by nebulization every 6 (six) hours as needed for up to 5 days for wheezing or shortness of breath. 06/05/17 06/10/17  Dhungel,  Nishant, MD  ALOE VERA PO Take 1 tablet by mouth daily.    [provider]  aspirin EC 81 MG tablet Take 81 mg by mouth daily.    [provider]  Biotin 10000 MCG TABS Take 1 tablet by mouth daily.    [provider]  clobetasol cream (TEMOVATE) 0.05 % USE LIGHTLY AS DIRECTED FOR VULVAR IRRITATION. DO NOT USE OVER 2 WEEKS WITHOUT STOPPING. 05/03/18   Jonnie Kind, MD  guaiFENesin (MUCINEX) 600 MG 12 hr tablet Take 1 tablet (600 mg total) by mouth 2 (two) times daily. 06/05/17    Dhungel, Flonnie Overman, MD  guaiFENesin (ROBITUSSIN) 100 MG/5ML SOLN Take 5 mLs (100 mg total) by mouth every 4 (four) hours as needed for cough or to loosen phlegm. 06/05/17   Dhungel, Flonnie Overman, MD  hydrochlorothiazide (HYDRODIURIL) 25 MG tablet Take 25 mg by mouth daily. 08/15/13   [provider]  pantoprazole (PROTONIX) 40 MG tablet Take 1 tablet (40 mg total) by mouth daily for 14 days. 06/05/17 06/19/17  Dhungel, Flonnie Overman, MD  ramipril (ALTACE) 10 MG capsule Take 2 capsules (20 mg total) by mouth daily. 01/08/17   Isaac Bliss, Rayford Halsted, MD  simvastatin (ZOCOR) 20 MG tablet Take 20 mg by mouth daily.     [provider]    Family History Family History  Problem Relation Age of Onset  . Hypertension Mother   . Cancer - Colon Mother   . Hypertension Father   . Colon cancer Father     Social History Social History   Tobacco Use  . Smoking status: Never Smoker  . Smokeless tobacco: Never Used  Substance Use Topics  . Alcohol use: Yes    Alcohol/week: 0.0 standard drinks    Comment: rare use  . Drug use: No     Allergies   Codeine   Review of Systems Review of Systems  All other systems reviewed and are negative.    Physical Exam Updated Vital Signs BP (!) 148/66 (BP Location: Left Wrist)   Pulse 60   Temp 98.5 F (36.9 C) (Oral)   Resp 16   Ht 5\' 2"  (1.575 m)   Wt 100.7 kg   SpO2 94%   BMI 40.60 kg/m   Physical Exam Vitals signs and nursing note reviewed. Exam conducted with a chaperone present.  Constitutional:      Appearance: Normal appearance.  HENT:     Head: Normocephalic and atraumatic.     Nose: No congestion or rhinorrhea.     Mouth/Throat:     Comments: Mildly erythematous.  Uvula midline.  No exudates.  No masses appreciated. Eyes:     General: No scleral icterus.    Conjunctiva/sclera: Conjunctivae normal.  Cardiovascular:     Rate and Rhythm: Normal rate and regular rhythm.     Pulses: Normal pulses.     Heart sounds: Normal  heart sounds.  Pulmonary:     Comments: No increased work of breathing or respiratory distress.  No accessory muscle use.  16 respirations per minute.  No wheezing noted on auscultation. Skin:    General: Skin is dry.  Neurological:     Mental Status: She is alert.     GCS: GCS eye subscore is 4. GCS verbal subscore is 5. GCS motor subscore is 6.  Psychiatric:        Mood and Affect: Mood normal.        Behavior: Behavior normal.  Thought Content: Thought content normal.      ED Treatments / Results  Labs (all labs ordered are listed, but only abnormal results are displayed) Labs Reviewed  NOVEL CORONAVIRUS, NAA (HOSP ORDER, SEND-OUT TO REF LAB; TAT 18-24 HRS)    EKG None  Radiology No results found.  Procedures Procedures (including critical care time)  Medications Ordered in ED Medications  dexamethasone (DECADRON) injection 8 mg (has no administration in time range)     Initial Impression / Assessment and Plan / ED Course  I have reviewed the triage vital signs and the nursing notes.  Pertinent labs & imaging results that were available during my care of the patient were reviewed by me and considered in my medical decision making (see chart for details).        Patient has a 2-week history of cough and has been treated with azithromycin and effectively and has now taking Augmentin for 2 days in addition to her Mucinex and Delsym for symptomatic relief.  Her daughter brought her in primarily due to concerns of hypoxia.  On exam, her SPO2 is 94% with no tachypnea or increased work of breathing.  Patient states that she continues to experience significant chest congestion despite antibiotics and OTC medications.    Informed her that perhaps her symptoms are viral in which case antibiotics would not be effective.  Provided patient with single dose of Decadron here in the ED to which she happily agreed.   At shift change care was left with Dr. Lacinda Axon who will  follow pending studies, re-evaluate, and determine disposition.      Final Clinical Impressions(s) / ED Diagnoses   Final diagnoses:  Cough    ED Discharge Orders    None       Corena Herter, PA-C 02/16/19 1639    Nat Christen, MD 02/20/19 1150

## 2019-02-17 LAB — NOVEL CORONAVIRUS, NAA (HOSP ORDER, SEND-OUT TO REF LAB; TAT 18-24 HRS): SARS-CoV-2, NAA: NOT DETECTED

## 2019-03-09 ENCOUNTER — Other Ambulatory Visit: Payer: Self-pay

## 2019-03-09 ENCOUNTER — Emergency Department (HOSPITAL_COMMUNITY): Payer: Medicare Other

## 2019-03-09 ENCOUNTER — Encounter (HOSPITAL_COMMUNITY): Payer: Self-pay | Admitting: Emergency Medicine

## 2019-03-09 ENCOUNTER — Emergency Department (HOSPITAL_COMMUNITY)
Admission: EM | Admit: 2019-03-09 | Discharge: 2019-03-09 | Disposition: A | Discharge status: Home / Self Care | Payer: Medicare Other | Attending: Emergency Medicine | Admitting: Emergency Medicine

## 2019-03-09 DIAGNOSIS — Z79899 Other long term (current) drug therapy: Secondary | ICD-10-CM | POA: Insufficient documentation

## 2019-03-09 DIAGNOSIS — I10 Essential (primary) hypertension: Secondary | ICD-10-CM | POA: Insufficient documentation

## 2019-03-09 DIAGNOSIS — R519 Headache, unspecified: Secondary | ICD-10-CM | POA: Diagnosis not present

## 2019-03-09 DIAGNOSIS — Z7982 Long term (current) use of aspirin: Secondary | ICD-10-CM | POA: Insufficient documentation

## 2019-03-09 DIAGNOSIS — G43809 Other migraine, not intractable, without status migrainosus: Secondary | ICD-10-CM | POA: Insufficient documentation

## 2019-03-09 DIAGNOSIS — E119 Type 2 diabetes mellitus without complications: Secondary | ICD-10-CM | POA: Insufficient documentation

## 2019-03-09 DIAGNOSIS — R1110 Vomiting, unspecified: Secondary | ICD-10-CM | POA: Diagnosis present

## 2019-03-09 LAB — CBC WITH DIFFERENTIAL/PLATELET
Abs Immature Granulocytes: 0.02 10*3/uL (ref 0.00–0.07)
Basophils Absolute: 0 10*3/uL (ref 0.0–0.1)
Basophils Relative: 0 %
Eosinophils Absolute: 0 10*3/uL (ref 0.0–0.5)
Eosinophils Relative: 0 %
HCT: 50.2 % — ABNORMAL HIGH (ref 36.0–46.0)
Hemoglobin: 16.8 g/dL — ABNORMAL HIGH (ref 12.0–15.0)
Immature Granulocytes: 0 %
Lymphocytes Relative: 19 %
Lymphs Abs: 1.5 10*3/uL (ref 0.7–4.0)
MCH: 30.3 pg (ref 26.0–34.0)
MCHC: 33.5 g/dL (ref 30.0–36.0)
MCV: 90.6 fL (ref 80.0–100.0)
Monocytes Absolute: 0.3 10*3/uL (ref 0.1–1.0)
Monocytes Relative: 4 %
Neutro Abs: 5.9 10*3/uL (ref 1.7–7.7)
Neutrophils Relative %: 77 %
Platelets: 318 10*3/uL (ref 150–400)
RBC: 5.54 MIL/uL — ABNORMAL HIGH (ref 3.87–5.11)
RDW: 13.7 % (ref 11.5–15.5)
WBC: 7.7 10*3/uL (ref 4.0–10.5)
nRBC: 0 % (ref 0.0–0.2)

## 2019-03-09 LAB — BASIC METABOLIC PANEL
Anion gap: 13 (ref 5–15)
BUN: 15 mg/dL (ref 8–23)
CO2: 29 mmol/L (ref 22–32)
Calcium: 9.5 mg/dL (ref 8.9–10.3)
Chloride: 96 mmol/L — ABNORMAL LOW (ref 98–111)
Creatinine, Ser: 0.84 mg/dL (ref 0.44–1.00)
GFR calc Af Amer: >60 mL/min (ref >60)
GFR calc non Af Amer: >60 mL/min (ref >60)
Glucose, Bld: 129 mg/dL — ABNORMAL HIGH (ref 70–99)
Potassium: 3.4 mmol/L — ABNORMAL LOW (ref 3.5–5.1)
Sodium: 138 mmol/L (ref 135–145)

## 2019-03-09 MED ORDER — SODIUM CHLORIDE 0.9 % IV BOLUS
500.0000 mL | Freq: Once | INTRAVENOUS | Status: AC
  Administered 2019-03-09: 500 mL via INTRAVENOUS

## 2019-03-09 MED ORDER — DIPHENHYDRAMINE HCL 50 MG/ML IJ SOLN
12.5000 mg | Freq: Once | INTRAMUSCULAR | Status: AC
  Administered 2019-03-09: 12.5 mg via INTRAVENOUS
  Filled 2019-03-09: qty 1

## 2019-03-09 MED ORDER — TRAMADOL HCL 50 MG PO TABS: 50.0000 mg | ORAL_TABLET | Freq: Four times a day (QID) | ORAL | 0 refills | Status: DC | PRN

## 2019-03-09 MED ORDER — METOCLOPRAMIDE HCL 5 MG/ML IJ SOLN
10.0000 mg | Freq: Once | INTRAMUSCULAR | Status: AC
  Administered 2019-03-09: 10 mg via INTRAVENOUS
  Filled 2019-03-09: qty 2

## 2019-03-09 MED ORDER — DEXAMETHASONE SODIUM PHOSPHATE 10 MG/ML IJ SOLN
10.0000 mg | Freq: Once | INTRAMUSCULAR | Status: AC
  Administered 2019-03-09: 10 mg via INTRAVENOUS
  Filled 2019-03-09: qty 1

## 2019-03-09 NOTE — Discharge Instructions (Addendum)
Ibuprofen 600 mg rotated with Tylenol 1000 mg every 4 hours as needed for pain.    Begin taking tramadol as needed for pain not relieved with Tylenol or ibuprofen.  Return to the emergency department if symptoms significantly worsen or change.

## 2019-03-09 NOTE — ED Provider Notes (Signed)
[facility] EMERGENCY DEPARTMENT Provider Note   CSN: [id number] Arrival date & time: 03/09/19  1349     History   Chief Complaint Chief Complaint  Patient presents with  . Emesis  . Headache    HPI Kimberly Miranda is a 78 y.o. female.     Patient is a an 78 year old female with past medical history of hypertension, diabetes, obesity.  She presents today for evaluation of headache.  This started 3 days ago and is worsening.  She describes a pressure to the back of her head that radiates to the front.  She denies any nausea or vomiting.  She denies any visual disturbances.  She denies any injury or trauma.  She denies any weakness or numbness of her extremities or face.  The history is provided by the patient.  Emesis Severity:  Moderate Duration:  3 days Timing:  Constant Progression:  Worsening Chronicity:  New Recent urination:  Normal Relieved by:  Nothing Worsened by:  Nothing   Past Medical History:  Diagnosis Date  . Diabetes ([place])    diet controlled since 2014  . Edema   . GERD (gastroesophageal reflux disease)   . High cholesterol   . Hypertension   . Migraine without aura, with intractable migraine, so stated, without mention of status migrainosus 08/31/2013  . Nephrolithiasis   . Other specified cardiac dysrhythmias(427.89)   . Vitiligo     Patient Active Problem List   Diagnosis Date Noted  . Acute bronchitis 06/04/2017  . Respiratory distress   . Hypokalemia   . Chest pain 01/07/2017  . Essential hypertension 01/07/2017  . Diabetes mellitus type 2 in obese ([place]) 01/07/2017  . Hyperlipidemia 01/07/2017  . Schatzki's ring   . Reflux esophagitis   . Hiatal hernia   . Diverticulosis of colon without hemorrhage   . History of colonic polyps   . Family history of colon cancer 05/09/2014  . Dysphagia, pharyngoesophageal phase 05/09/2014  . Tubular adenoma of colon 05/09/2014  . Intractable migraine without aura 08/31/2013    Past Surgical  History:  Procedure Laterality Date  . ABDOMINAL HYSTERECTOMY    . BACK SURGERY    . COLONOSCOPY  2011   Dr. Arnoldo Morale: sessile polyp transverse colon (tubular adenoma)   . COLONOSCOPY N/A 05/30/2014   Dr. Rourk:multiple colonic polyps/colonic diverticulosis/internal hemorrhoids. Tubular adenomas. Surveillance in 5 years if health permits.   . ESOPHAGOGASTRODUODENOSCOPY N/A 05/30/2014   Dr. Rourk:schatzki's ring with superimposed erosive reflux esophagitis, s/p dilation. hiatal hernia  . MALONEY DILATION N/A 05/30/2014   Procedure: Venia Minks DILATION;  Surgeon: Daneil Dolin, MD;  Location: [facility];  Service: Endoscopy;  Laterality: N/A;  . NASAL SINUS SURGERY    . TONSILLECTOMY       OB History   No obstetric history on file.      Home Medications    Prior to Admission medications   Medication Sig Start Date End Date Taking? Authorizing Provider  albuterol (PROVENTIL) (2.5 MG/3ML) 0.083% nebulizer solution Take 3 mLs (2.5 mg total) by nebulization every 6 (six) hours as needed for up to 5 days for wheezing or shortness of breath. Patient not taking: Reported on 02/16/2019 06/05/17 06/10/17  Dhungel, Flonnie Overman, MD  ALOE VERA PO Take 1 tablet by mouth daily.    [provider]  aspirin EC 81 MG tablet Take 81 mg by mouth daily.    [provider]  Biotin 10000 MCG TABS Take 1 tablet by mouth daily.  [provider]  clobetasol cream (TEMOVATE) 0.05 % USE LIGHTLY AS DIRECTED FOR VULVAR IRRITATION. DO NOT USE OVER 2 WEEKS WITHOUT STOPPING. Patient taking differently: Apply 1 application topically.  05/03/18   Jonnie Kind, MD  furosemide (LASIX) 40 MG tablet Take 20 mg by mouth daily.     [provider]  guaiFENesin (MUCINEX) 600 MG 12 hr tablet Take 1 tablet (600 mg total) by mouth 2 (two) times daily. 06/05/17   Dhungel, Flonnie Overman, MD  guaiFENesin (ROBITUSSIN) 100 MG/5ML SOLN Take 5 mLs (100 mg total) by mouth every 4 (four) hours as needed for cough  or to loosen phlegm. Patient not taking: Reported on 02/16/2019 06/05/17   Dhungel, Flonnie Overman, MD  hydrochlorothiazide (HYDRODIURIL) 25 MG tablet Take 25 mg by mouth daily. 08/15/13   [provider]  metFORMIN (GLUCOPHAGE) 500 MG tablet Take 500 mg by mouth 2 (two) times daily with a meal.  09/06/18   [provider]  pantoprazole (PROTONIX) 40 MG tablet Take 1 tablet (40 mg total) by mouth daily for 14 days. 06/05/17 06/19/17  Dhungel, Flonnie Overman, MD  ramipril (ALTACE) 10 MG capsule Take 2 capsules (20 mg total) by mouth daily. 01/08/17   Isaac Bliss, Rayford Halsted, MD  simvastatin (ZOCOR) 20 MG tablet Take 20 mg by mouth daily.     [provider]  spironolactone (ALDACTONE) 25 MG tablet  09/06/18   [provider]    Family History Family History  Problem Relation Age of Onset  . Hypertension Mother   . Cancer - Colon Mother   . Hypertension Father   . Colon cancer Father     Social History Social History   Tobacco Use  . Smoking status: Never Smoker  . Smokeless tobacco: Never Used  Substance Use Topics  . Alcohol use: Yes    Alcohol/week: 0.0 standard drinks    Comment: rare use  . Drug use: No     Allergies   Codeine   Review of Systems Review of Systems  All other systems reviewed and are negative.    Physical Exam Updated Vital Signs BP (!) 166/71 (BP Location: Right Arm)   Pulse 62   Temp (!) 97.5 F (36.4 C) (Oral)   Resp 14   Ht 5\' 2"  (1.575 m)   Wt 99.8 kg   SpO2 93%   BMI 40.24 kg/m   Physical Exam Vitals signs and nursing note reviewed.  Constitutional:      General: She is not in acute distress.    Appearance: She is well-developed. She is not diaphoretic.  HENT:     Head: Normocephalic and atraumatic.  Eyes:     Extraocular Movements: Extraocular movements intact.     Pupils: Pupils are equal, round, and reactive to light.  Neck:     Musculoskeletal: Normal range of motion and neck supple.  Cardiovascular:      Rate and Rhythm: Normal rate and regular rhythm.     Heart sounds: No murmur. No friction rub. No gallop.   Pulmonary:     Effort: Pulmonary effort is normal. No respiratory distress.     Breath sounds: Normal breath sounds. No wheezing.  Abdominal:     General: Bowel sounds are normal. There is no distension.     Palpations: Abdomen is soft.     Tenderness: There is no abdominal tenderness.  Musculoskeletal: Normal range of motion.  Skin:    General: Skin is warm and dry.  Neurological:  Mental Status: She is alert and oriented to person, place, and time.     Cranial Nerves: No cranial nerve deficit, dysarthria or facial asymmetry.     Motor: No weakness.     Coordination: Coordination normal.      ED Treatments / Results  Labs (all labs ordered are listed, but only abnormal results are displayed) Labs Reviewed  BASIC METABOLIC PANEL  CBC WITH DIFFERENTIAL/PLATELET    EKG None  Radiology No results found.  Procedures Procedures (including critical care time)  Medications Ordered in ED Medications  sodium chloride 0.9 % bolus 500 mL (has no administration in time range)  metoCLOPramide (REGLAN) injection 10 mg (has no administration in time range)  dexamethasone (DECADRON) injection 10 mg (has no administration in time range)  diphenhydrAMINE (BENADRYL) injection 12.5 mg (has no administration in time range)     Initial Impression / Assessment and Plan / ED Course  I have reviewed the triage vital signs and the nursing notes.  Pertinent labs & imaging results that were available during my care of the patient were reviewed by me and considered in my medical decision making (see chart for details).  Patient presenting here with complaints of headache.  She is neurologically intact and CT scan is negative.  She was given a migraine cocktail with good results.  Patient will be discharged with tramadol which she can take if her symptoms return.  To return to the ER  as needed if symptoms worsen or change.  Final Clinical Impressions(s) / ED Diagnoses   Final diagnoses:  None    ED Discharge Orders    None       Veryl Speak, MD 03/09/19 2211

## 2019-03-09 NOTE — ED Triage Notes (Signed)
Pt states that she has been having headaches for the past 3 days and she has been vomiting that started today.

## 2019-03-12 DIAGNOSIS — R519 Headache, unspecified: Secondary | ICD-10-CM | POA: Diagnosis not present

## 2019-03-12 DIAGNOSIS — E785 Hyperlipidemia, unspecified: Secondary | ICD-10-CM | POA: Diagnosis not present

## 2019-03-12 DIAGNOSIS — R1110 Vomiting, unspecified: Secondary | ICD-10-CM | POA: Diagnosis not present

## 2019-03-12 DIAGNOSIS — G43909 Migraine, unspecified, not intractable, without status migrainosus: Secondary | ICD-10-CM | POA: Diagnosis not present

## 2019-03-12 DIAGNOSIS — E119 Type 2 diabetes mellitus without complications: Secondary | ICD-10-CM | POA: Diagnosis not present

## 2019-03-12 DIAGNOSIS — Z87891 Personal history of nicotine dependence: Secondary | ICD-10-CM | POA: Diagnosis not present

## 2019-03-12 DIAGNOSIS — K219 Gastro-esophageal reflux disease without esophagitis: Secondary | ICD-10-CM | POA: Diagnosis not present

## 2019-03-12 DIAGNOSIS — I10 Essential (primary) hypertension: Secondary | ICD-10-CM | POA: Diagnosis not present

## 2019-03-16 ENCOUNTER — Ambulatory Visit (INDEPENDENT_AMBULATORY_CARE_PROVIDER_SITE_OTHER): Payer: Medicare Other | Admitting: Neurology

## 2019-03-16 ENCOUNTER — Other Ambulatory Visit: Payer: Self-pay

## 2019-03-16 ENCOUNTER — Encounter: Payer: Self-pay | Admitting: Neurology

## 2019-03-16 VITALS — BP 130/64 | HR 62 | Temp 97.6°F | Ht 62.0 in | Wt 215.0 lb

## 2019-03-16 DIAGNOSIS — G43019 Migraine without aura, intractable, without status migrainosus: Secondary | ICD-10-CM

## 2019-03-16 DIAGNOSIS — G43001 Migraine without aura, not intractable, with status migrainosus: Secondary | ICD-10-CM | POA: Diagnosis not present

## 2019-03-16 MED ORDER — METOCLOPRAMIDE HCL 10 MG PO TABS: 10.0000 mg | ORAL_TABLET | Freq: Four times a day (QID) | ORAL | 1 refills | Status: DC | PRN

## 2019-03-16 MED ORDER — TOPIRAMATE 25 MG PO TABS: ORAL_TABLET | ORAL | 3 refills | Status: DC

## 2019-03-16 NOTE — Progress Notes (Signed)
Reason for visit: Migraine headache  Referring physician: [place]  Kimberly Miranda is a 78 y.o. female  History of present illness:  Kimberly Miranda is an 78 year old right-handed white female with a history of migraine headache.  The patient was last seen through this office over 3 years ago, she had done very well with use of Topamax and was able to taper off the medication and still control the headache.  In the past, she has demonstrated that she may go from having 1 or 2 headaches a year to suddenly begin having daily headaches.  The patient apparently began feeling poorly around 06 March 2019, she woke up feeling somewhat dizzy and heavy in the head.  Within 48 hours, she began having severe daily headaches that have continued, she has gone to the emergency room on 2 occasions, once on 5 November and again on 12 March 2019.  A CT scan of the brain was done and was unremarkable, the patient has some chronic right maxillary sinus disease.  The patient indicates that her headaches began on the back of the head and spread to the top of the head associated with some nausea without vomiting, the patient has photophobia without phonophobia.  The patient will have to go to bed and lie down, she feels better when she is lying down.  The patient is having a lot of vertigo with the headache as well and she feels unsteady on her feet as the dizziness and vertigo gets worse when she is walking.  She denies any visual field disturbances or any numbness or weakness of the face, arms, legs.  She is sent to this office for further evaluation.  She denies any neck pain or neck stiffness or pain down the arms.  Past Medical History:  Diagnosis Date  . Diabetes ([place])    diet controlled since 2014  . Edema   . GERD (gastroesophageal reflux disease)   . High cholesterol   . Hypertension   . Migraine without aura, with intractable migraine, so stated, without mention of status migrainosus 08/31/2013  .  Nephrolithiasis   . Other specified cardiac dysrhythmias(427.89)   . Vitiligo     Past Surgical History:  Procedure Laterality Date  . ABDOMINAL HYSTERECTOMY    . BACK SURGERY    . COLONOSCOPY  2011   Dr. Arnoldo Morale: sessile polyp transverse colon (tubular adenoma)   . COLONOSCOPY N/A 05/30/2014   Dr. Rourk:multiple colonic polyps/colonic diverticulosis/internal hemorrhoids. Tubular adenomas. Surveillance in 5 years if health permits.   . ESOPHAGOGASTRODUODENOSCOPY N/A 05/30/2014   Dr. Rourk:schatzki's ring with superimposed erosive reflux esophagitis, s/p dilation. hiatal hernia  . MALONEY DILATION N/A 05/30/2014   Procedure: Venia Minks DILATION;  Surgeon: Daneil Dolin, MD;  [facility];  Service: Endoscopy;  Laterality: N/A;  . NASAL SINUS SURGERY    . TONSILLECTOMY      Family History  Problem Relation Age of Onset  . Hypertension Mother   . Cancer - Colon Mother   . Hypertension Father   . Colon cancer Father     Social history:  reports that she has never smoked. She has never used smokeless tobacco. She reports current alcohol use. She reports that she does not use drugs.  Medications:  Prior to Admission medications   Medication Sig Start Date End Date Taking? Authorizing Provider  ALOE VERA PO Take 1 tablet by mouth daily.   Yes [provider]  aspirin EC 81 MG tablet Take  81 mg by mouth daily.   Yes [provider]  Biotin 10000 MCG TABS Take 1 tablet by mouth daily.   Yes [provider]  clobetasol cream (TEMOVATE) 0.05 % USE LIGHTLY AS DIRECTED FOR VULVAR IRRITATION. DO NOT USE OVER 2 WEEKS WITHOUT STOPPING. Patient taking differently: Apply 1 application topically.  05/03/18  Yes Jonnie Kind, MD  furosemide (LASIX) 40 MG tablet Take 20 mg by mouth daily.    Yes [provider]  guaiFENesin (MUCINEX) 600 MG 12 hr tablet Take 1 tablet (600 mg total) by mouth 2 (two) times daily. 06/05/17  Yes Dhungel, Nishant, MD   guaiFENesin (ROBITUSSIN) 100 MG/5ML SOLN Take 5 mLs (100 mg total) by mouth every 4 (four) hours as needed for cough or to loosen phlegm. 06/05/17  Yes Dhungel, Nishant, MD  hydrochlorothiazide (HYDRODIURIL) 25 MG tablet Take 25 mg by mouth daily. 08/15/13  Yes [provider]  metFORMIN (GLUCOPHAGE) 500 MG tablet Take 500 mg by mouth 2 (two) times daily with a meal.  09/06/18  Yes [provider]  ramipril (ALTACE) 10 MG capsule Take 2 capsules (20 mg total) by mouth daily. 01/08/17  Yes Isaac Bliss, Rayford Halsted, MD  simvastatin (ZOCOR) 20 MG tablet Take 20 mg by mouth daily.    Yes [provider]  spironolactone (ALDACTONE) 25 MG tablet  09/06/18  Yes [provider]  traMADol (ULTRAM) 50 MG tablet Take 1 tablet (50 mg total) by mouth every 6 (six) hours as needed. 03/09/19  Yes Delo, Nathaneil Canary, MD  albuterol (PROVENTIL) (2.5 MG/3ML) 0.083% nebulizer solution Take 3 mLs (2.5 mg total) by nebulization every 6 (six) hours as needed for up to 5 days for wheezing or shortness of breath. Patient not taking: Reported on 02/16/2019 06/05/17 06/10/17  Dhungel, Flonnie Overman, MD  pantoprazole (PROTONIX) 40 MG tablet Take 1 tablet (40 mg total) by mouth daily for 14 days. 06/05/17 06/19/17  Louellen Molder, MD      Allergies  Allergen Reactions  . Codeine Other (See Comments)    Stomach upset    ROS:  Out of a complete 14 system review of symptoms, the patient complains only of the following symptoms, and all other reviewed systems are negative.  Headache Vertigo  Blood pressure 130/64, pulse 62, temperature 97.6 F (36.4 C), temperature source Temporal, height 5\' 2"  (1.575 m), weight 215 lb (97.5 kg).  Physical Exam  General: The patient is alert and cooperative at the time of the examination.  The patient is moderately to markedly obese.  Eyes: Pupils are equal, round, and reactive to light. Discs are flat bilaterally.  Neck: The neck is supple, no carotid bruits are  noted.  Respiratory: The respiratory examination is clear.  Cardiovascular: The cardiovascular examination reveals a regular rate and rhythm, no obvious murmurs or rubs are noted.  Skin: Extremities are without significant edema.  Neurologic Exam  Mental status: The patient is alert and oriented x 3 at the time of the examination. The patient has apparent normal recent and remote memory, with an apparently normal attention span and concentration ability.  Cranial nerves: Facial symmetry is present. There is good sensation of the face to pinprick and soft touch bilaterally. The strength of the facial muscles and the muscles to head turning and shoulder shrug are normal bilaterally. Speech is well enunciated, no aphasia or dysarthria is noted. Extraocular movements are full. Visual fields are full. The tongue is midline, and the patient has symmetric elevation of the  soft palate. No obvious hearing deficits are noted.  Motor: The motor testing reveals 5 over 5 strength of all 4 extremities. Good symmetric motor tone is noted throughout.  Sensory: Sensory testing is intact to pinprick, soft touch, vibration sensation, and position sense on all 4 extremities. No evidence of extinction is noted.  Coordination: Cerebellar testing reveals good finger-nose-finger and heel-to-shin bilaterally.  Gait and station: Gait is normal. Tandem gait is slightly unsteady. Romberg is negative. No drift is seen.  Reflexes: Deep tendon reflexes are symmetric and normal bilaterally. Toes are downgoing bilaterally.   CT head 03/09/19:  IMPRESSION: 1. No evidence for acute intracranial abnormality. 2. RIGHT maxillary sinus disease.  * CT scan images were reviewed online. I agree with the written report.    Assessment/Plan:  1.  Migraine headache, intractable  The patient has had a sudden resurgence of her headache once again.  In the past, she responded quite well to Topamax, this will be restarted along  with metoclopramide to take if needed for nausea.  The patient will be sent for a Depacon injection, 1 g, and IV Phenergan which seemed to help 5 years ago.  The patient will follow-up here in 3 months.  Jill Alexanders MD 03/16/2019 10:12 AM  [facility] [address] [employer] [place], [place] [address]  Phone [phone] Fax [phone]

## 2019-03-16 NOTE — Patient Instructions (Signed)
We will start Topamax for the headache and metoclopramide if needed for nausea.  Topamax (topiramate) is a seizure medication that has an FDA approval for seizures and for migraine headache. Potential side effects of this medication include weight loss, cognitive slowing, tingling in the fingers and toes, and carbonated drinks will taste bad. If any significant side effects are noted on this drug, please contact our office.

## 2019-04-03 DIAGNOSIS — N3000 Acute cystitis without hematuria: Secondary | ICD-10-CM | POA: Diagnosis not present

## 2019-04-03 DIAGNOSIS — N390 Urinary tract infection, site not specified: Secondary | ICD-10-CM | POA: Diagnosis not present

## 2019-04-19 ENCOUNTER — Encounter: Payer: Self-pay | Admitting: Gastroenterology

## 2019-05-09 DIAGNOSIS — B0233 Zoster keratitis: Secondary | ICD-10-CM | POA: Diagnosis not present

## 2019-05-16 DIAGNOSIS — B0233 Zoster keratitis: Secondary | ICD-10-CM | POA: Diagnosis not present

## 2019-05-17 DIAGNOSIS — Z23 Encounter for immunization: Secondary | ICD-10-CM | POA: Diagnosis not present

## 2019-05-18 DIAGNOSIS — N3946 Mixed incontinence: Secondary | ICD-10-CM | POA: Diagnosis not present

## 2019-05-18 DIAGNOSIS — N952 Postmenopausal atrophic vaginitis: Secondary | ICD-10-CM | POA: Diagnosis not present

## 2019-05-18 DIAGNOSIS — N3000 Acute cystitis without hematuria: Secondary | ICD-10-CM | POA: Diagnosis not present

## 2019-06-06 DIAGNOSIS — E119 Type 2 diabetes mellitus without complications: Secondary | ICD-10-CM | POA: Diagnosis not present

## 2019-06-06 DIAGNOSIS — H35372 Puckering of macula, left eye: Secondary | ICD-10-CM | POA: Diagnosis not present

## 2019-06-06 DIAGNOSIS — H401134 Primary open-angle glaucoma, bilateral, indeterminate stage: Secondary | ICD-10-CM | POA: Diagnosis not present

## 2019-06-06 DIAGNOSIS — H353131 Nonexudative age-related macular degeneration, bilateral, early dry stage: Secondary | ICD-10-CM | POA: Diagnosis not present

## 2019-06-14 DIAGNOSIS — Z23 Encounter for immunization: Secondary | ICD-10-CM | POA: Diagnosis not present

## 2019-06-19 ENCOUNTER — Encounter: Payer: Self-pay | Admitting: Neurology

## 2019-06-19 ENCOUNTER — Other Ambulatory Visit: Payer: Self-pay

## 2019-06-19 ENCOUNTER — Ambulatory Visit (INDEPENDENT_AMBULATORY_CARE_PROVIDER_SITE_OTHER): Payer: Medicare Other | Admitting: Neurology

## 2019-06-19 VITALS — BP 135/72 | HR 82 | Temp 96.9°F | Ht 62.0 in | Wt 214.0 lb

## 2019-06-19 DIAGNOSIS — G43019 Migraine without aura, intractable, without status migrainosus: Secondary | ICD-10-CM | POA: Diagnosis not present

## 2019-06-19 MED ORDER — TOPIRAMATE 25 MG PO TABS: ORAL_TABLET | ORAL | 3 refills | Status: DC

## 2019-06-19 NOTE — Patient Instructions (Signed)
Try stopping the Topamax after you finish your next prescription. Call if the headaches return.

## 2019-06-19 NOTE — Progress Notes (Signed)
Reason for visit: Migraine headache  Kimberly Miranda is an 79 y.o. female  History of present illness:  Kimberly Miranda is an 79 year old right-handed white female with a history of migraine headache.  The patient may go several years without any headache and then have onset of severe daily headaches.  She is on Topamax, she cannot tolerate doses more than 25 mg daily.  She has not had any headaches since last seen.  She is tolerating the medication currently fairly well.  She denies any other new medical problems that have come up since last seen, she does have degenerative arthritis of the right knee which impairs her ability to walk at times.  Past Medical History:  Diagnosis Date  . Diabetes ([place])    diet controlled since 2014  . Edema   . GERD (gastroesophageal reflux disease)   . High cholesterol   . Hypertension   . Migraine without aura, with intractable migraine, so stated, without mention of status migrainosus 08/31/2013  . Nephrolithiasis   . Other specified cardiac dysrhythmias(427.89)   . Vitiligo     Past Surgical History:  Procedure Laterality Date  . ABDOMINAL HYSTERECTOMY    . BACK SURGERY    . COLONOSCOPY  2011   Dr. Arnoldo Morale: sessile polyp transverse colon (tubular adenoma)   . COLONOSCOPY N/A 05/30/2014   Dr. Rourk:multiple colonic polyps/colonic diverticulosis/internal hemorrhoids. Tubular adenomas. Surveillance in 5 years if health permits.   . ESOPHAGOGASTRODUODENOSCOPY N/A 05/30/2014   Dr. Rourk:schatzki's ring with superimposed erosive reflux esophagitis, s/p dilation. hiatal hernia  . MALONEY DILATION N/A 05/30/2014   Procedure: Venia Minks DILATION;  Surgeon: Daneil Dolin, MD;  Location: [facility];  Service: Endoscopy;  Laterality: N/A;  . NASAL SINUS SURGERY    . TONSILLECTOMY      Family History  Problem Relation Age of Onset  . Hypertension Mother   . Cancer - Colon Mother   . Hypertension Father   . Colon cancer Father     Social history:   reports that she has never smoked. She has never used smokeless tobacco. She reports current alcohol use. She reports that she does not use drugs.    Allergies  Allergen Reactions  . Codeine Other (See Comments)    Stomach upset    Medications:  Prior to Admission medications   Medication Sig Start Date End Date Taking? Authorizing Provider  ALOE VERA PO Take 1 tablet by mouth daily.   Yes [provider]  aspirin EC 81 MG tablet Take 81 mg by mouth daily.   Yes [provider]  Biotin 10000 MCG TABS Take 1 tablet by mouth daily.   Yes [provider]  clobetasol cream (TEMOVATE) 0.05 % USE LIGHTLY AS DIRECTED FOR VULVAR IRRITATION. DO NOT USE OVER 2 WEEKS WITHOUT STOPPING. Patient taking differently: Apply 1 application topically.  05/03/18  Yes Jonnie Kind, MD  furosemide (LASIX) 40 MG tablet Take 20 mg by mouth daily.    Yes [provider]  guaiFENesin (MUCINEX) 600 MG 12 hr tablet Take 1 tablet (600 mg total) by mouth 2 (two) times daily. 06/05/17  Yes Dhungel, Nishant, MD  guaiFENesin (ROBITUSSIN) 100 MG/5ML SOLN Take 5 mLs (100 mg total) by mouth every 4 (four) hours as needed for cough or to loosen phlegm. 06/05/17  Yes Dhungel, Nishant, MD  hydrochlorothiazide (HYDRODIURIL) 25 MG tablet Take 25 mg by mouth daily. 08/15/13  Yes [provider]  metFORMIN (GLUCOPHAGE) 500 MG tablet  Take 500 mg by mouth 2 (two) times daily with a meal.  09/06/18  Yes [provider]  metoCLOPramide (REGLAN) 10 MG tablet Take 1 tablet (10 mg total) by mouth every 6 (six) hours as needed for nausea. 03/16/19  Yes Kathrynn Ducking, MD  pantoprazole (PROTONIX) 40 MG tablet Take 1 tablet (40 mg total) by mouth daily for 14 days. 06/05/17 06/19/19 Yes Dhungel, Nishant, MD  ramipril (ALTACE) 10 MG capsule Take 2 capsules (20 mg total) by mouth daily. 01/08/17  Yes Isaac Bliss, Rayford Halsted, MD  simvastatin (ZOCOR) 20 MG tablet Take 20 mg by mouth daily.    Yes  [provider]  spironolactone (ALDACTONE) 25 MG tablet  09/06/18  Yes [provider]  topiramate (TOPAMAX) 25 MG tablet One tablet twice a day for one week, then take one in the morning and 2 at night 03/16/19  Yes Kathrynn Ducking, MD  traMADol (ULTRAM) 50 MG tablet Take 1 tablet (50 mg total) by mouth every 6 (six) hours as needed. 03/09/19  Yes Delo, Nathaneil Canary, MD  albuterol (PROVENTIL) (2.5 MG/3ML) 0.083% nebulizer solution Take 3 mLs (2.5 mg total) by nebulization every 6 (six) hours as needed for up to 5 days for wheezing or shortness of breath. Patient not taking: Reported on 02/16/2019 06/05/17 06/10/17  Dhungel, Flonnie Overman, MD    ROS:  Out of a complete 14 system review of symptoms, the patient complains only of the following symptoms, and all other reviewed systems are negative.  Arthritis pain History of headache  Blood pressure 135/72, pulse 82, temperature (!) 96.9 F (36.1 C), height 5\' 2"  (1.575 m), weight 214 lb (97.1 kg).  Physical Exam  General: The patient is alert and cooperative at the time of the examination.  The patient is markedly obese.  Skin: No significant peripheral edema is noted.   Neurologic Exam  Mental status: The patient is alert and oriented x 3 at the time of the examination. The patient has apparent normal recent and remote memory, with an apparently normal attention span and concentration ability.   Cranial nerves: Facial symmetry is present. Speech is normal, no aphasia or dysarthria is noted. Extraocular movements are full. Visual fields are full.  Motor: The patient has good strength in all 4 extremities.  Sensory examination: Soft touch sensation is symmetric on the face, arms, and legs.  Coordination: The patient has good finger-nose-finger and heel-to-shin bilaterally.  Gait and station: The patient has a slightly wide-based gait.  The patient walk independently.  Romberg is negative.  Reflexes: Deep tendon reflexes are  symmetric.   Assessment/Plan:  1.  Migraine headache  The patient is doing quite well, her headaches have disappeared on the Topamax.  She will stop the medication.  If the headaches return, she will contact our office, otherwise she will be seen here if needed.  Jill Alexanders MD 06/19/2019 12:44 PM  [facility] [address] [place], [place] [address]  Phone [phone] Fax [phone]

## 2019-07-11 DIAGNOSIS — N3020 Other chronic cystitis without hematuria: Secondary | ICD-10-CM | POA: Diagnosis not present

## 2019-07-11 DIAGNOSIS — N952 Postmenopausal atrophic vaginitis: Secondary | ICD-10-CM | POA: Diagnosis not present

## 2019-07-11 DIAGNOSIS — N3946 Mixed incontinence: Secondary | ICD-10-CM | POA: Diagnosis not present

## 2019-07-27 ENCOUNTER — Other Ambulatory Visit (HOSPITAL_COMMUNITY): Payer: Self-pay | Admitting: Internal Medicine

## 2019-07-27 DIAGNOSIS — Z1231 Encounter for screening mammogram for malignant neoplasm of breast: Secondary | ICD-10-CM

## 2019-08-11 DIAGNOSIS — M1711 Unilateral primary osteoarthritis, right knee: Secondary | ICD-10-CM | POA: Diagnosis not present

## 2019-08-28 DIAGNOSIS — E119 Type 2 diabetes mellitus without complications: Secondary | ICD-10-CM | POA: Diagnosis not present

## 2019-09-01 DIAGNOSIS — Z6839 Body mass index (BMI) 39.0-39.9, adult: Secondary | ICD-10-CM | POA: Diagnosis not present

## 2019-09-01 DIAGNOSIS — R609 Edema, unspecified: Secondary | ICD-10-CM | POA: Diagnosis not present

## 2019-09-01 DIAGNOSIS — I10 Essential (primary) hypertension: Secondary | ICD-10-CM | POA: Diagnosis not present

## 2019-09-01 DIAGNOSIS — E1129 Type 2 diabetes mellitus with other diabetic kidney complication: Secondary | ICD-10-CM | POA: Diagnosis not present

## 2019-09-07 ENCOUNTER — Other Ambulatory Visit: Payer: Self-pay

## 2019-09-07 ENCOUNTER — Ambulatory Visit (HOSPITAL_COMMUNITY)
Admission: RE | Admit: 2019-09-07 | Discharge: 2019-09-07 | Disposition: A | Discharge status: Home / Self Care | Payer: Medicare Other | Source: Ambulatory Visit | Attending: Internal Medicine | Admitting: Internal Medicine

## 2019-09-07 DIAGNOSIS — Z1231 Encounter for screening mammogram for malignant neoplasm of breast: Secondary | ICD-10-CM | POA: Diagnosis not present

## 2019-11-20 ENCOUNTER — Ambulatory Visit (INDEPENDENT_AMBULATORY_CARE_PROVIDER_SITE_OTHER): Payer: Medicare Other | Admitting: Gastroenterology

## 2019-11-20 ENCOUNTER — Encounter: Payer: Self-pay | Admitting: Gastroenterology

## 2019-11-20 ENCOUNTER — Other Ambulatory Visit: Payer: Self-pay

## 2019-11-20 VITALS — BP 155/75 | Temp 97.3°F | Ht 62.0 in | Wt 213.0 lb

## 2019-11-20 DIAGNOSIS — Z800 Family history of malignant neoplasm of digestive organs: Secondary | ICD-10-CM | POA: Diagnosis not present

## 2019-11-20 DIAGNOSIS — Z86010 Personal history of colonic polyps: Secondary | ICD-10-CM

## 2019-11-20 NOTE — Patient Instructions (Signed)
Colonoscopy as scheduled. See separate instructions.  

## 2019-11-20 NOTE — H&P (View-Only) (Signed)
Primary Care Physician: Asencion Noble, MD  Primary Gastroenterologist:  Garfield Cornea, MD   Chief Complaint  Patient presents with  . Colonoscopy    HPI: Kimberly Miranda is a 79 y.o. female here for follow up. She came up for recall for surveillance colonoscopy for personal history of adenomatous colon polyps.  Both parents also had colon cancer but at an advanced age.  Patient's last colonoscopy was in January 2016, multiple colonic polyps removed, tubular adenomas.  Colonic diverticulosis and internal hemorrhoids.  Advised 5-year surveillance colonoscopy if health permits.  She also had a EGD in January 2016 with Schatzki ring with superimposed erosive reflux esophagitis status post dilation.  Hiatal hernia.  No bowel concerns. BM regular. No melena, brbpr. No abdominal pain. No reflux concerns on medication. No swallowing concerns.        Current Outpatient Medications  Medication Sig Dispense Refill  . aspirin EC 81 MG tablet Take 81 mg by mouth daily.    Marland Kitchen estradiol (ESTRACE) 0.1 MG/GM vaginal cream Place 1 Applicatorful vaginally 2 (two) times a week.    . furosemide (LASIX) 40 MG tablet Take 20 mg by mouth as needed.     . hydrochlorothiazide (HYDRODIURIL) 25 MG tablet Take 25 mg by mouth daily.    . metFORMIN (GLUCOPHAGE) 500 MG tablet Take 500 mg by mouth 2 (two) times daily with a meal.     . metoprolol succinate (TOPROL-XL) 25 MG 24 hr tablet Take 25 mg by mouth daily.    . pantoprazole (PROTONIX) 40 MG tablet Take 1 tablet (40 mg total) by mouth daily for 14 days. 14 tablet 0  . ramipril (ALTACE) 10 MG capsule Take 2 capsules (20 mg total) by mouth daily. (Patient taking differently: Take 10 mg by mouth daily. ) 60 capsule 2  . simvastatin (ZOCOR) 20 MG tablet Take 20 mg by mouth daily.     Marland Kitchen spironolactone (ALDACTONE) 25 MG tablet Take 25 mg by mouth daily.     Marland Kitchen albuterol (PROVENTIL) (2.5 MG/3ML) 0.083% nebulizer solution Take 3 mLs (2.5 mg total) by nebulization  every 6 (six) hours as needed for up to 5 days for wheezing or shortness of breath. (Patient not taking: Reported on 02/16/2019) 75 mL 0   No current facility-administered medications for this visit.    Allergies as of 11/20/2019 - Review Complete 11/20/2019  Allergen Reaction Noted  . Codeine Other (See Comments) 08/20/2013   Past Medical History:  Diagnosis Date  . Diabetes ([place])    diet controlled since 2014  . Edema   . GERD (gastroesophageal reflux disease)   . High cholesterol   . Hypertension   . Migraine without aura, with intractable migraine, so stated, without mention of status migrainosus 08/31/2013  . Nephrolithiasis   . Other specified cardiac dysrhythmias(427.89)   . Vitiligo    Past Surgical History:  Procedure Laterality Date  . ABDOMINAL HYSTERECTOMY    . BACK SURGERY    . COLONOSCOPY  2011   Dr. Arnoldo Morale: sessile polyp transverse colon (tubular adenoma)   . COLONOSCOPY N/A 05/30/2014   Dr. Rourk:multiple colonic polyps/colonic diverticulosis/internal hemorrhoids. Tubular adenomas. Surveillance in 5 years if health permits.   . ESOPHAGOGASTRODUODENOSCOPY N/A 05/30/2014   Dr. Rourk:schatzki's ring with superimposed erosive reflux esophagitis, s/p dilation. hiatal hernia  . MALONEY DILATION N/A 05/30/2014   Procedure: Venia Minks DILATION;  Surgeon: Daneil Dolin, MD;  [facility];  Service: Endoscopy;  Laterality: N/A;  .  NASAL SINUS SURGERY    . TONSILLECTOMY     Family History  Problem Relation Age of Onset  . Hypertension Mother   . Cancer - Colon Mother        at age greater than 88  . Hypertension Father   . Colon cancer Father        at age greater than 5   Social History   Tobacco Use  . Smoking status: Never Smoker  . Smokeless tobacco: Never Used  Substance Use Topics  . Alcohol use: Not Currently    Alcohol/week: 0.0 standard drinks    Comment: rare use  . Drug use: No    ROS:  General: Negative for anorexia, weight loss,  fever, chills, fatigue, weakness. ENT: Negative for hoarseness, difficulty swallowing , nasal congestion. CV: Negative for chest pain, angina, palpitations, dyspnea on exertion, peripheral edema.  Respiratory: Negative for dyspnea at rest, dyspnea on exertion, cough, sputum, wheezing.  GI: See history of present illness. GU:  Negative for dysuria, hematuria, urinary incontinence, urinary frequency, nocturnal urination.  Endo: Negative for unusual weight change.    Physical Examination:   BP (!) 155/75   Temp (!) 97.3 F (36.3 C) (Oral)   Ht 5\' 2"  (1.575 m)   Wt 213 lb (96.6 kg)   BMI 38.96 kg/m   General: Well-nourished, well-developed elderly female in no acute distress.  Eyes: No icterus. Mouth: masked. Lungs: Clear to auscultation bilaterally.  Heart: Regular rate and rhythm, no murmurs rubs or gallops.  Abdomen: Bowel sounds are normal, nontender, nondistended, no hepatosplenomegaly or masses, no abdominal bruits or hernia , no rebound or guarding.   Extremities: No lower extremity edema. No clubbing or deformities. Neuro: Alert and oriented x 4   Skin: Warm and dry, no jaundice.   Psych: Alert and cooperative, normal mood and affect.  Labs:  Lab Results  Component Value Date   CREATININE 0.84 03/09/2019   BUN 15 03/09/2019   NA 138 03/09/2019   K 3.4 (L) 03/09/2019   CL 96 (L) 03/09/2019   CO2 29 03/09/2019    Lab Results  Component Value Date   WBC 7.7 03/09/2019   HGB 16.8 (H) 03/09/2019   HCT 50.2 (H) 03/09/2019   MCV 90.6 03/09/2019   PLT 318 03/09/2019     Imaging Studies: No results found.   Impression/Plan:  Pleasant 79 year old female presenting to schedule 5 year surveillance colonoscopy for personal history of adenomatous colon polyps and family history of colon cancer in both parents.  Patient denies any GI symptoms.  Reflux well controlled on medication.  We discussed potential holding off on future surveillance colonoscopies based on more  recent guidelines that she is very interested in pursuing at least 1 more surveillance colonoscopy given both parents developed colon cancer in their 2s and 40s and she is now 57.  Plan on colonoscopy in the near future, conscious sedation.  ASA II.  I have discussed the risks, alternatives, benefits with regards to but not limited to the risk of reaction to medication, bleeding, infection, perforation and the patient is agreeable to proceed. Written consent to be obtained.

## 2019-11-20 NOTE — Progress Notes (Signed)
Primary Care Physician: Asencion Noble, MD  Primary Gastroenterologist:  Garfield Cornea, MD   Chief Complaint  Patient presents with  . Colonoscopy    HPI: Kimberly Miranda is a 79 y.o. female here for follow up. She came up for recall for surveillance colonoscopy for personal history of adenomatous colon polyps.  Both parents also had colon cancer but at an advanced age.  Patient's last colonoscopy was in January 2016, multiple colonic polyps removed, tubular adenomas.  Colonic diverticulosis and internal hemorrhoids.  Advised 5-year surveillance colonoscopy if health permits.  She also had a EGD in January 2016 with Schatzki ring with superimposed erosive reflux esophagitis status post dilation.  Hiatal hernia.  No bowel concerns. BM regular. No melena, brbpr. No abdominal pain. No reflux concerns on medication. No swallowing concerns.        Current Outpatient Medications  Medication Sig Dispense Refill  . aspirin EC 81 MG tablet Take 81 mg by mouth daily.    Marland Kitchen estradiol (ESTRACE) 0.1 MG/GM vaginal cream Place 1 Applicatorful vaginally 2 (two) times a week.    . furosemide (LASIX) 40 MG tablet Take 20 mg by mouth as needed.     . hydrochlorothiazide (HYDRODIURIL) 25 MG tablet Take 25 mg by mouth daily.    . metFORMIN (GLUCOPHAGE) 500 MG tablet Take 500 mg by mouth 2 (two) times daily with a meal.     . metoprolol succinate (TOPROL-XL) 25 MG 24 hr tablet Take 25 mg by mouth daily.    . pantoprazole (PROTONIX) 40 MG tablet Take 1 tablet (40 mg total) by mouth daily for 14 days. 14 tablet 0  . ramipril (ALTACE) 10 MG capsule Take 2 capsules (20 mg total) by mouth daily. (Patient taking differently: Take 10 mg by mouth daily. ) 60 capsule 2  . simvastatin (ZOCOR) 20 MG tablet Take 20 mg by mouth daily.     Marland Kitchen spironolactone (ALDACTONE) 25 MG tablet Take 25 mg by mouth daily.     Marland Kitchen albuterol (PROVENTIL) (2.5 MG/3ML) 0.083% nebulizer solution Take 3 mLs (2.5 mg total) by nebulization  every 6 (six) hours as needed for up to 5 days for wheezing or shortness of breath. (Patient not taking: Reported on 02/16/2019) 75 mL 0   No current facility-administered medications for this visit.    Allergies as of 11/20/2019 - Review Complete 11/20/2019  Allergen Reaction Noted  . Codeine Other (See Comments) 08/20/2013   Past Medical History:  Diagnosis Date  . Diabetes ([place])    diet controlled since 2014  . Edema   . GERD (gastroesophageal reflux disease)   . High cholesterol   . Hypertension   . Migraine without aura, with intractable migraine, so stated, without mention of status migrainosus 08/31/2013  . Nephrolithiasis   . Other specified cardiac dysrhythmias(427.89)   . Vitiligo    Past Surgical History:  Procedure Laterality Date  . ABDOMINAL HYSTERECTOMY    . BACK SURGERY    . COLONOSCOPY  2011   Dr. Arnoldo Morale: sessile polyp transverse colon (tubular adenoma)   . COLONOSCOPY N/A 05/30/2014   Dr. Rourk:multiple colonic polyps/colonic diverticulosis/internal hemorrhoids. Tubular adenomas. Surveillance in 5 years if health permits.   . ESOPHAGOGASTRODUODENOSCOPY N/A 05/30/2014   Dr. Rourk:schatzki's ring with superimposed erosive reflux esophagitis, s/p dilation. hiatal hernia  . MALONEY DILATION N/A 05/30/2014   Procedure: Venia Minks DILATION;  Surgeon: Daneil Dolin, MD;  Location: [facility];  Service: Endoscopy;  Laterality: N/A;  .  NASAL SINUS SURGERY    . TONSILLECTOMY     Family History  Problem Relation Age of Onset  . Hypertension Mother   . Cancer - Colon Mother        at age greater than 66  . Hypertension Father   . Colon cancer Father        at age greater than 79   Social History   Tobacco Use  . Smoking status: Never Smoker  . Smokeless tobacco: Never Used  Substance Use Topics  . Alcohol use: Not Currently    Alcohol/week: 0.0 standard drinks    Comment: rare use  . Drug use: No    ROS:  General: Negative for anorexia, weight loss,  fever, chills, fatigue, weakness. ENT: Negative for hoarseness, difficulty swallowing , nasal congestion. CV: Negative for chest pain, angina, palpitations, dyspnea on exertion, peripheral edema.  Respiratory: Negative for dyspnea at rest, dyspnea on exertion, cough, sputum, wheezing.  GI: See history of present illness. GU:  Negative for dysuria, hematuria, urinary incontinence, urinary frequency, nocturnal urination.  Endo: Negative for unusual weight change.    Physical Examination:   BP (!) 155/75   Temp (!) 97.3 F (36.3 C) (Oral)   Ht 5\' 2"  (1.575 m)   Wt 213 lb (96.6 kg)   BMI 38.96 kg/m   General: Well-nourished, well-developed elderly female in no acute distress.  Eyes: No icterus. Mouth: masked. Lungs: Clear to auscultation bilaterally.  Heart: Regular rate and rhythm, no murmurs rubs or gallops.  Abdomen: Bowel sounds are normal, nontender, nondistended, no hepatosplenomegaly or masses, no abdominal bruits or hernia , no rebound or guarding.   Extremities: No lower extremity edema. No clubbing or deformities. Neuro: Alert and oriented x 4   Skin: Warm and dry, no jaundice.   Psych: Alert and cooperative, normal mood and affect.  Labs:  Lab Results  Component Value Date   CREATININE 0.84 03/09/2019   BUN 15 03/09/2019   NA 138 03/09/2019   K 3.4 (L) 03/09/2019   CL 96 (L) 03/09/2019   CO2 29 03/09/2019    Lab Results  Component Value Date   WBC 7.7 03/09/2019   HGB 16.8 (H) 03/09/2019   HCT 50.2 (H) 03/09/2019   MCV 90.6 03/09/2019   PLT 318 03/09/2019     Imaging Studies: No results found.   Impression/Plan:  Pleasant 79 year old female presenting to schedule 5 year surveillance colonoscopy for personal history of adenomatous colon polyps and family history of colon cancer in both parents.  Patient denies any GI symptoms.  Reflux well controlled on medication.  We discussed potential holding off on future surveillance colonoscopies based on more  recent guidelines that she is very interested in pursuing at least 1 more surveillance colonoscopy given both parents developed colon cancer in their 25s and 20s and she is now 85.  Plan on colonoscopy in the near future, conscious sedation.  ASA II.  I have discussed the risks, alternatives, benefits with regards to but not limited to the risk of reaction to medication, bleeding, infection, perforation and the patient is agreeable to proceed. Written consent to be obtained.

## 2019-12-11 ENCOUNTER — Other Ambulatory Visit: Payer: Self-pay

## 2019-12-11 ENCOUNTER — Other Ambulatory Visit (HOSPITAL_COMMUNITY)
Admission: RE | Admit: 2019-12-11 | Discharge: 2019-12-11 | Disposition: A | Discharge status: Home / Self Care | Payer: Medicare Other | Source: Ambulatory Visit | Attending: Internal Medicine | Admitting: Internal Medicine

## 2019-12-11 DIAGNOSIS — Z20822 Contact with and (suspected) exposure to covid-19: Secondary | ICD-10-CM | POA: Insufficient documentation

## 2019-12-11 DIAGNOSIS — Z01812 Encounter for preprocedural laboratory examination: Secondary | ICD-10-CM | POA: Diagnosis not present

## 2019-12-11 LAB — SARS CORONAVIRUS 2 (TAT 6-24 HRS): SARS Coronavirus 2: NEGATIVE

## 2019-12-13 ENCOUNTER — Encounter (HOSPITAL_COMMUNITY): Payer: Self-pay | Admitting: Internal Medicine

## 2019-12-13 ENCOUNTER — Ambulatory Visit (HOSPITAL_COMMUNITY)
Admission: RE | Admit: 2019-12-13 | Discharge: 2019-12-13 | Disposition: A | Discharge status: Home / Self Care | Payer: Medicare Other | Attending: Internal Medicine | Admitting: Internal Medicine

## 2019-12-13 ENCOUNTER — Other Ambulatory Visit: Payer: Self-pay

## 2019-12-13 ENCOUNTER — Encounter (HOSPITAL_COMMUNITY)
Admission: RE | Disposition: A | Discharge status: Home / Self Care | Payer: Self-pay | Source: Home / Self Care | Attending: Internal Medicine

## 2019-12-13 DIAGNOSIS — Z79899 Other long term (current) drug therapy: Secondary | ICD-10-CM | POA: Diagnosis not present

## 2019-12-13 DIAGNOSIS — I10 Essential (primary) hypertension: Secondary | ICD-10-CM | POA: Insufficient documentation

## 2019-12-13 DIAGNOSIS — Z86010 Personal history of colonic polyps: Secondary | ICD-10-CM | POA: Insufficient documentation

## 2019-12-13 DIAGNOSIS — K635 Polyp of colon: Secondary | ICD-10-CM

## 2019-12-13 DIAGNOSIS — K621 Rectal polyp: Secondary | ICD-10-CM | POA: Diagnosis not present

## 2019-12-13 DIAGNOSIS — Z7982 Long term (current) use of aspirin: Secondary | ICD-10-CM | POA: Insufficient documentation

## 2019-12-13 DIAGNOSIS — E119 Type 2 diabetes mellitus without complications: Secondary | ICD-10-CM | POA: Insufficient documentation

## 2019-12-13 DIAGNOSIS — E7800 Pure hypercholesterolemia, unspecified: Secondary | ICD-10-CM | POA: Diagnosis not present

## 2019-12-13 DIAGNOSIS — D123 Benign neoplasm of transverse colon: Secondary | ICD-10-CM | POA: Diagnosis not present

## 2019-12-13 DIAGNOSIS — K5730 Diverticulosis of large intestine without perforation or abscess without bleeding: Secondary | ICD-10-CM | POA: Insufficient documentation

## 2019-12-13 DIAGNOSIS — Z800 Family history of malignant neoplasm of digestive organs: Secondary | ICD-10-CM | POA: Insufficient documentation

## 2019-12-13 DIAGNOSIS — Z1211 Encounter for screening for malignant neoplasm of colon: Secondary | ICD-10-CM | POA: Diagnosis not present

## 2019-12-13 DIAGNOSIS — G43909 Migraine, unspecified, not intractable, without status migrainosus: Secondary | ICD-10-CM | POA: Diagnosis not present

## 2019-12-13 DIAGNOSIS — Z7984 Long term (current) use of oral hypoglycemic drugs: Secondary | ICD-10-CM | POA: Diagnosis not present

## 2019-12-13 HISTORY — PX: POLYPECTOMY: SHX5525

## 2019-12-13 HISTORY — PX: COLONOSCOPY: SHX5424

## 2019-12-13 LAB — GLUCOSE, CAPILLARY: Glucose-Capillary: 138 mg/dL — ABNORMAL HIGH (ref 70–99)

## 2019-12-13 SURGERY — COLONOSCOPY
Anesthesia: Moderate Sedation

## 2019-12-13 MED ORDER — ONDANSETRON HCL 4 MG/2ML IJ SOLN
INTRAMUSCULAR | Status: AC
  Filled 2019-12-13: qty 2

## 2019-12-13 MED ORDER — MEPERIDINE HCL 100 MG/ML IJ SOLN
INTRAMUSCULAR | Status: DC | PRN
  Administered 2019-12-13: 25 mg
  Administered 2019-12-13: 15 mg via INTRAVENOUS

## 2019-12-13 MED ORDER — MIDAZOLAM HCL 5 MG/5ML IJ SOLN
INTRAMUSCULAR | Status: AC
  Filled 2019-12-13: qty 5

## 2019-12-13 MED ORDER — MIDAZOLAM HCL 5 MG/5ML IJ SOLN
INTRAMUSCULAR | Status: DC | PRN
  Administered 2019-12-13 (×3): 1 mg via INTRAVENOUS

## 2019-12-13 MED ORDER — SODIUM CHLORIDE 0.9 % IV SOLN: INTRAVENOUS | Status: DC

## 2019-12-13 MED ORDER — MEPERIDINE HCL 50 MG/ML IJ SOLN
INTRAMUSCULAR | Status: AC
  Filled 2019-12-13: qty 1

## 2019-12-13 MED ORDER — ONDANSETRON HCL 4 MG/2ML IJ SOLN
INTRAMUSCULAR | Status: DC | PRN
  Administered 2019-12-13: 4 mg via INTRAVENOUS

## 2019-12-13 NOTE — Interval H&P Note (Signed)
History and Physical Interval Note:  12/13/2019 9:11 AM  Kimberly Miranda  has presented today for surgery, with the diagnosis of family history of colorectal cancer, history of colon polyps.  The various methods of treatment have been discussed with the patient and family. After consideration of risks, benefits and other options for treatment, the patient has consented to  Procedure(s) with comments: COLONOSCOPY (N/A) - 9:15am as a surgical intervention.  The patient's history has been reviewed, patient examined, no change in status, stable for surgery.  I have reviewed the patient's chart and labs.  Questions were answered to the patient's satisfaction.     Manus Rudd    Patient seen and examined.  No change.  Given both parents with colon cancer and a personal history colonic polyps with is not unreasonable to offer 1 more colonoscopy today.  Patient is driving for 1 more examination.  The risks, benefits, limitations, alternatives and imponderables have been reviewed with the patient. Questions have been answered. All parties are agreeable.

## 2019-12-13 NOTE — Discharge Instructions (Signed)
Colonoscopy Discharge Instructions  Read the instructions outlined below and refer to this sheet in the next few weeks. These discharge instructions provide you with general information on caring for yourself after you leave the hospital. Your doctor may also give you specific instructions. While your treatment has been planned according to the most current medical practices available, unavoidable complications occasionally occur. If you have any problems or questions after discharge, call Dr. Gala Romney at [phone]. ACTIVITY  You may resume your regular activity, but move at a slower pace for the next 24 hours.   Take frequent rest periods for the next 24 hours.   Walking will help get rid of the air and reduce the bloated feeling in your belly (abdomen).   No driving for 24 hours (because of the medicine (anesthesia) used during the test).    Do not sign any important legal documents or operate any machinery for 24 hours (because of the anesthesia used during the test).  NUTRITION  Drink plenty of fluids.   You may resume your normal diet as instructed by your doctor.   Begin with a light meal and progress to your normal diet. Heavy or fried foods are harder to digest and may make you feel sick to your stomach (nauseated).   Avoid alcoholic beverages for 24 hours or as instructed.  MEDICATIONS  You may resume your normal medications unless your doctor tells you otherwise.  WHAT YOU CAN EXPECT TODAY  Some feelings of bloating in the abdomen.   Passage of more gas than usual.   Spotting of blood in your stool or on the toilet paper.  IF YOU HAD POLYPS REMOVED DURING THE COLONOSCOPY:  No aspirin products for 7 days or as instructed.   No alcohol for 7 days or as instructed.   Eat a soft diet for the next 24 hours.  FINDING OUT THE RESULTS OF YOUR TEST Not all test results are available during your visit. If your test results are not back during the visit, make an appointment  with your caregiver to find out the results. Do not assume everything is normal if you have not heard from your caregiver or the medical facility. It is important for you to follow up on all of your test results.  SEEK IMMEDIATE MEDICAL ATTENTION IF:  You have more than a spotting of blood in your stool.   Your belly is swollen (abdominal distention).   You are nauseated or vomiting.   You have a temperature over 101.   You have abdominal pain or discomfort that is severe or gets worse throughout the day.    Colon Polyps  Polyps are tissue growths inside the body. Polyps can grow in many places, including the large intestine (colon). A polyp may be a round bump or a mushroom-shaped growth. You could have one polyp or several. Most colon polyps are noncancerous (benign). However, some colon polyps can become cancerous over time. Finding and removing the polyps early can help prevent this. What are the causes? The exact cause of colon polyps is not known. What increases the risk? You are more likely to develop this condition if you:  Have a family history of colon cancer or colon polyps.  Are older than 67 or older than 45 if you are African American.  Have inflammatory bowel disease, such as ulcerative colitis or Crohn's disease.  Have certain hereditary conditions, such as: ? Familial adenomatous polyposis. ? Lynch syndrome. ? Turcot syndrome. ? Peutz-Jeghers syndrome.  Are  overweight.  Smoke cigarettes.  Do not get enough exercise.  Drink too much alcohol.  Eat a diet that is high in fat and red meat and low in fiber.  Had childhood cancer that was treated with abdominal radiation. What are the signs or symptoms? Most polyps do not cause symptoms. If you have symptoms, they may include:  Blood coming from your rectum when having a bowel movement.  Blood in your stool. The stool may look dark red or black.  Abdominal pain.  A change in bowel habits, such as  constipation or diarrhea. How is this diagnosed? This condition is diagnosed with a colonoscopy. This is a procedure in which a lighted, flexible scope is inserted into the anus and then passed into the colon to examine the area. Polyps are sometimes found when a colonoscopy is done as part of routine cancer screening tests. How is this treated? Treatment for this condition involves removing any polyps that are found. Most polyps can be removed during a colonoscopy. Those polyps will then be tested for cancer. Additional treatment may be needed depending on the results of testing. Follow these instructions at home: Lifestyle  Maintain a healthy weight, or lose weight if recommended by your health care provider.  Exercise every day or as told by your health care provider.  Do not use any products that contain nicotine or tobacco, such as cigarettes and e-cigarettes. If you need help quitting, ask your health care provider.  If you drink alcohol, limit how much you have: ? 0-1 drink a day for women. ? 0-2 drinks a day for men.  Be aware of how much alcohol is in your drink. In the [place] one drink equals one 12 oz bottle of beer (355 mL), one 5 oz glass of wine (148 mL), or one 1 oz shot of hard liquor (44 mL). Eating and drinking   Eat foods that are high in fiber, such as fruits, vegetables, and whole grains.  Eat foods that are high in calcium and vitamin D, such as milk, cheese, yogurt, eggs, liver, fish, and broccoli.  Limit foods that are high in fat, such as fried foods and desserts.  Limit the amount of red meat and processed meat you eat, such as hot dogs, sausage, bacon, and lunch meats. General instructions  Keep all follow-up visits as told by your health care provider. This is important. ? This includes having regularly scheduled colonoscopies. ? Talk to your health care provider about when you need a colonoscopy. Contact a health care provider if:  You have new or  worsening bleeding during a bowel movement.  You have new or increased blood in your stool.  You have a change in bowel habits.  You lose weight for no known reason. Summary  Polyps are tissue growths inside the body. Polyps can grow in many places, including the colon.  Most colon polyps are noncancerous (benign), but some can become cancerous over time.  This condition is diagnosed with a colonoscopy.  Treatment for this condition involves removing any polyps that are found. Most polyps can be removed during a colonoscopy. This information is not intended to replace advice given to you by your health care provider. Make sure you discuss any questions you have with your health care provider. Document Revised: 08/05/2017 Document Reviewed: 08/05/2017 Elsevier Patient Education  [employer].   3 polyps removed from your colon today  Further recommendations to follow pending review of pathology report  At patient request  I called Rosanne Ashing at [phone]  -left detailed message on answering service

## 2019-12-13 NOTE — Op Note (Signed)
[facility] Patient Name: Kimberly Miranda Procedure Date: 12/13/2019 9:07 AM MRN: [id number] Date of Birth: Nov 18, 1940 Attending MD: Norvel Richards , MD CSN: [id number] Age: 79 Admit Type: Outpatient Procedure:                Colonoscopy Indications:              Screening for colorectal malignant neoplasm, High                            risk colon cancer surveillance: Personal history of                            colonic polyps Providers:                Norvel Richards, MD, Janeece Riggers, RN, Raphael Gibney, Technician Referring MD:              Medicines:                Meperidine 40 mg IV, Midazolam 4 mg IV Complications:            No immediate complications. Estimated Blood Loss:     Estimated blood loss was minimal. Procedure:                Pre-Anesthesia Assessment:                           - Prior to the procedure, a History and Physical                            was performed, and patient medications and                            allergies were reviewed. The patient's tolerance of                            previous anesthesia was also reviewed. The risks                            and benefits of the procedure and the sedation                            options and risks were discussed with the patient.                            All questions were answered, and informed consent                            was obtained. Prior Anticoagulants: The patient has                            taken no previous anticoagulant or antiplatelet  agents. ASA Grade Assessment: II - A patient with                            mild systemic disease. After reviewing the risks                            and benefits, the patient was deemed in                            satisfactory condition to undergo the procedure.                           After obtaining informed consent, the colonoscope                            was passed  under direct vision. Throughout the                            procedure, the patient's blood pressure, pulse, and                            oxygen saturations were monitored continuously. The                            CF-[id number] ([id number]) scope was introduced through                            the anus and advanced to the the cecum, identified                            by appendiceal orifice and ileocecal valve. The                            colonoscopy was performed without difficulty. The                            patient tolerated the procedure well. The quality                            of the bowel preparation was adequate. The                            ileocecal valve, appendiceal orifice, and rectum                            were photographed. The entire colon was well                            visualized. Scope In: 9:24:11 AM Scope Out: 9:39:46 AM Scope Withdrawal Time: 0 hours 13 minutes 25 seconds  Total Procedure Duration: 0 hours 15 minutes 35 seconds  Findings:      The perianal and digital rectal examinations were normal.      Scattered medium-mouthed diverticula were found in the sigmoid colon and  descending colon.      Three semi-pedunculated polyps were found in the mid rectum(1) and       hepatic flexure (2). The polyps were 4 to 5 mm in size. These polyps       were removed with a cold snare. Resection and retrieval were complete.       Estimated blood loss was minimal.      The exam was otherwise without abnormality on direct and retroflexion       views. Impression:               - Diverticulosis in the sigmoid colon and in the                            descending colon.                           - Three 4 to 5 mm polyps in the rectum, in the mid                            rectum and at the hepatic flexure, removed with a                            cold snare. Resected and retrieved.                           - The examination was otherwise normal  on direct                            and retroflexion views. Moderate Sedation:      Moderate (conscious) sedation was administered by the endoscopy nurse       and supervised by the endoscopist. The following parameters were       monitored: oxygen saturation, heart rate, blood pressure, respiratory       rate, EKG, adequacy of pulmonary ventilation, and response to care.       Total physician intraservice time was 22 minutes. Recommendation:           - Patient has a contact number available for                            emergencies. The signs and symptoms of potential                            delayed complications were discussed with the                            patient. Return to normal activities tomorrow.                            Written discharge instructions were provided to the                            patient.                           - Resume previous diet.                           -  Continue present medications.                           - Repeat colonoscopy date to be determined after                            pending pathology results are reviewed for                            surveillance based on pathology results.                           - Return to GI office (date not yet determined). Procedure Code(s):        --- Professional ---                           [phone], Colonoscopy, flexible; with removal of                            tumor(s), polyp(s), or other lesion(s) by snare                            technique                           G0500, Moderate sedation services provided by the                            same physician or other qualified health care                            professional performing a gastrointestinal                            endoscopic service that sedation supports,                            requiring the presence of an independent trained                            observer to assist in the monitoring of the                             patient's level of consciousness and physiological                            status; initial 15 minutes of intra-service time;                            patient age 19 years or older (additional time may                            be reported with [phone], as appropriate) Diagnosis Code(s):        --- Professional ---  Z12.11, Encounter for screening for malignant                            neoplasm of colon                           Z86.010, Personal history of colonic polyps                           K62.1, Rectal polyp                           K63.5, Polyp of colon                           K57.30, Diverticulosis of large intestine without                            perforation or abscess without bleeding CPT copyright 2019 American Medical Association. All rights reserved. The codes documented in this report are preliminary and upon coder review may  be revised to meet current compliance requirements. Cristopher Estimable. Staisha Winiarski, MD Norvel Richards, MD 12/13/2019 9:50:07 AM This report has been signed electronically. Number of Addenda: 0

## 2019-12-15 ENCOUNTER — Encounter: Payer: Self-pay | Admitting: Internal Medicine

## 2019-12-15 LAB — SURGICAL PATHOLOGY

## 2019-12-18 ENCOUNTER — Encounter (HOSPITAL_COMMUNITY): Payer: Self-pay | Admitting: Internal Medicine

## 2020-01-08 IMAGING — CR DG CHEST 1V PORT
1 series · 1 of 1 positions shown · non-contrast
Comparison: June 04, 2017

CLINICAL DATA: Cough and congestion for 2 weeks.

EXAM:
PORTABLE CHEST 1 VIEW

[portable]
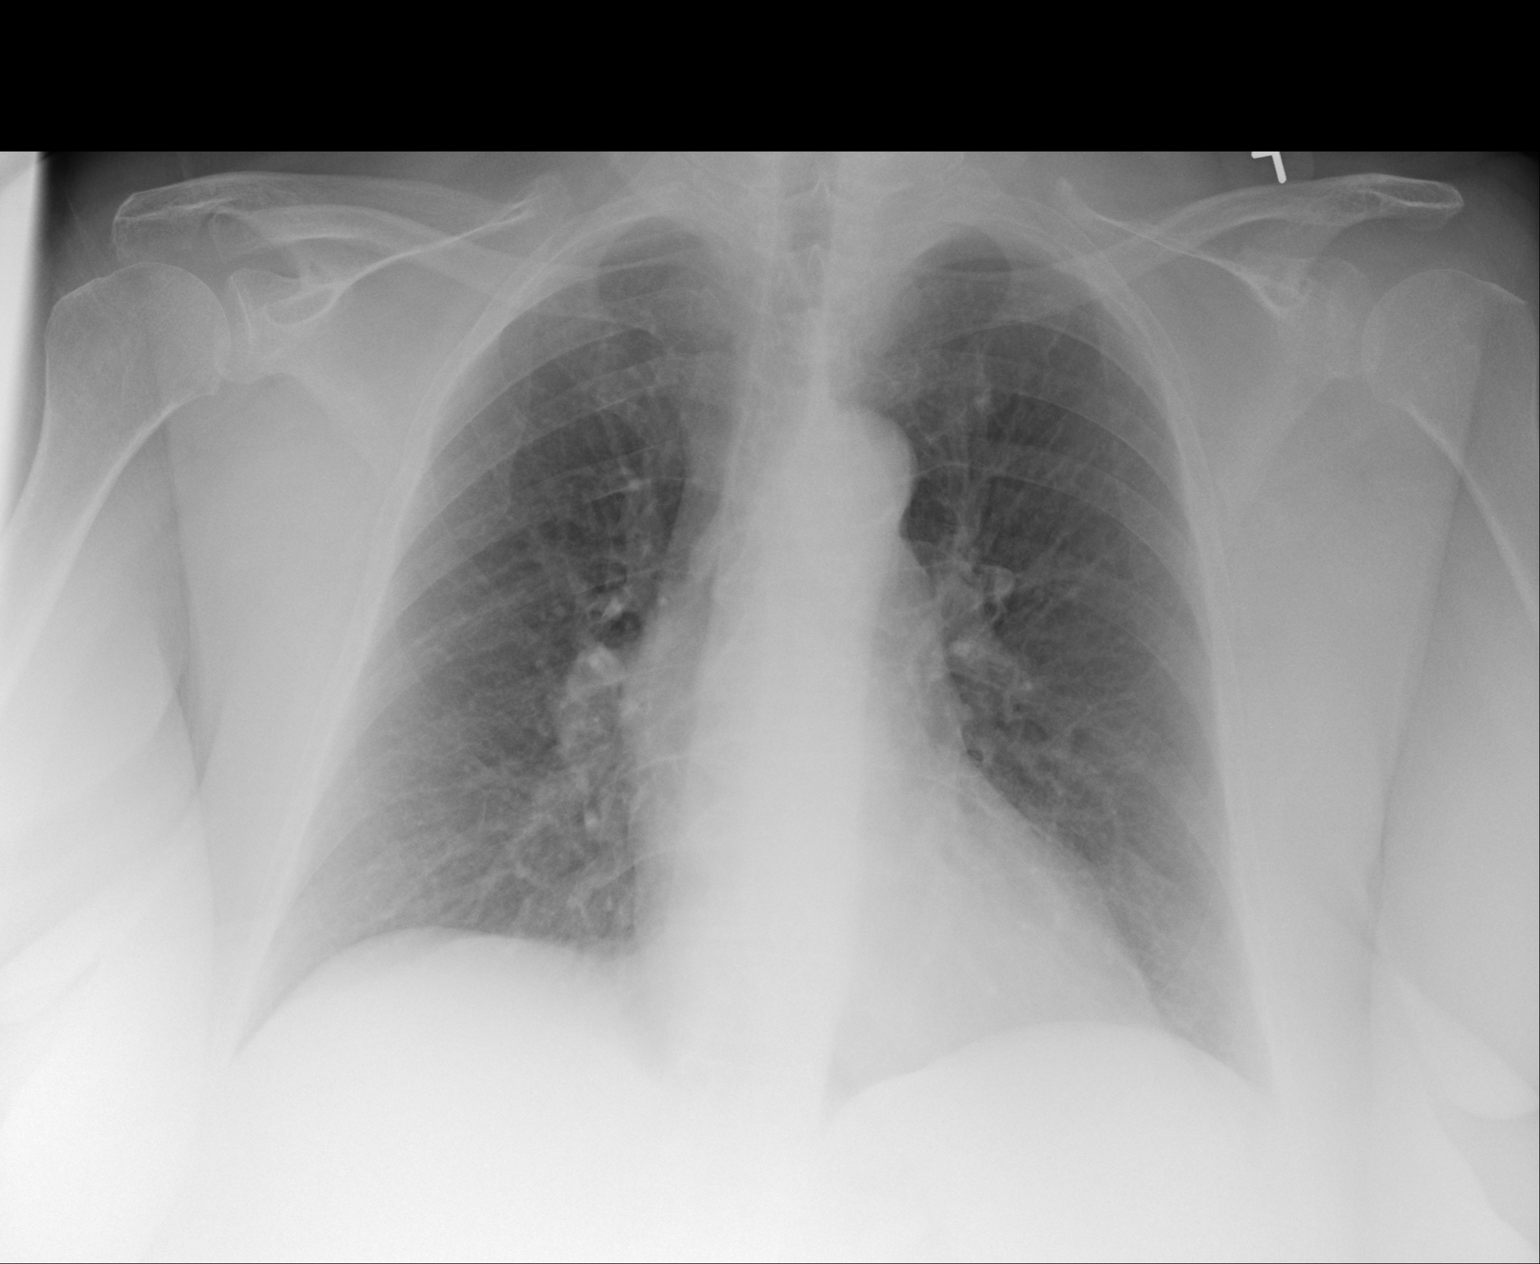

[1 of 1 positions shown; findings below may reference images not displayed]

FINDINGS: Cardiomediastinal silhouette is normal. Mediastinal contours appear
intact.

There is no evidence of focal airspace consolidation, pleural
effusion or pneumothorax.

Osseous structures are without acute abnormality. Soft tissues are
grossly normal.
IMPRESSION: No active disease.

## 2020-01-11 DIAGNOSIS — N952 Postmenopausal atrophic vaginitis: Secondary | ICD-10-CM | POA: Diagnosis not present

## 2020-01-11 DIAGNOSIS — N3946 Mixed incontinence: Secondary | ICD-10-CM | POA: Diagnosis not present

## 2020-01-11 DIAGNOSIS — N3020 Other chronic cystitis without hematuria: Secondary | ICD-10-CM | POA: Diagnosis not present

## 2020-01-16 DIAGNOSIS — C44629 Squamous cell carcinoma of skin of left upper limb, including shoulder: Secondary | ICD-10-CM | POA: Diagnosis not present

## 2020-01-18 DIAGNOSIS — C44629 Squamous cell carcinoma of skin of left upper limb, including shoulder: Secondary | ICD-10-CM | POA: Diagnosis not present

## 2020-02-22 DIAGNOSIS — Z08 Encounter for follow-up examination after completed treatment for malignant neoplasm: Secondary | ICD-10-CM | POA: Diagnosis not present

## 2020-02-22 DIAGNOSIS — Z85828 Personal history of other malignant neoplasm of skin: Secondary | ICD-10-CM | POA: Diagnosis not present

## 2020-02-22 DIAGNOSIS — D225 Melanocytic nevi of trunk: Secondary | ICD-10-CM | POA: Diagnosis not present

## 2020-03-01 DIAGNOSIS — Z23 Encounter for immunization: Secondary | ICD-10-CM | POA: Diagnosis not present

## 2020-03-08 DIAGNOSIS — Z23 Encounter for immunization: Secondary | ICD-10-CM | POA: Diagnosis not present

## 2020-07-26 DIAGNOSIS — H409 Unspecified glaucoma: Secondary | ICD-10-CM | POA: Diagnosis not present

## 2020-07-26 DIAGNOSIS — I10 Essential (primary) hypertension: Secondary | ICD-10-CM | POA: Diagnosis not present

## 2020-07-26 DIAGNOSIS — E1129 Type 2 diabetes mellitus with other diabetic kidney complication: Secondary | ICD-10-CM | POA: Diagnosis not present

## 2020-07-26 DIAGNOSIS — E785 Hyperlipidemia, unspecified: Secondary | ICD-10-CM | POA: Diagnosis not present

## 2020-07-26 DIAGNOSIS — G43909 Migraine, unspecified, not intractable, without status migrainosus: Secondary | ICD-10-CM | POA: Diagnosis not present

## 2020-07-26 DIAGNOSIS — Z79899 Other long term (current) drug therapy: Secondary | ICD-10-CM | POA: Diagnosis not present

## 2020-08-02 DIAGNOSIS — Z6841 Body Mass Index (BMI) 40.0 and over, adult: Secondary | ICD-10-CM | POA: Diagnosis not present

## 2020-08-02 DIAGNOSIS — I10 Essential (primary) hypertension: Secondary | ICD-10-CM | POA: Diagnosis not present

## 2020-08-02 DIAGNOSIS — E1122 Type 2 diabetes mellitus with diabetic chronic kidney disease: Secondary | ICD-10-CM | POA: Diagnosis not present

## 2020-08-02 DIAGNOSIS — R7309 Other abnormal glucose: Secondary | ICD-10-CM | POA: Diagnosis not present

## 2020-08-02 DIAGNOSIS — I480 Paroxysmal atrial fibrillation: Secondary | ICD-10-CM | POA: Diagnosis not present

## 2020-08-02 DIAGNOSIS — E785 Hyperlipidemia, unspecified: Secondary | ICD-10-CM | POA: Diagnosis not present

## 2020-08-09 DIAGNOSIS — E119 Type 2 diabetes mellitus without complications: Secondary | ICD-10-CM | POA: Diagnosis not present

## 2020-08-09 DIAGNOSIS — D3132 Benign neoplasm of left choroid: Secondary | ICD-10-CM | POA: Diagnosis not present

## 2020-08-09 DIAGNOSIS — H353131 Nonexudative age-related macular degeneration, bilateral, early dry stage: Secondary | ICD-10-CM | POA: Diagnosis not present

## 2020-08-09 DIAGNOSIS — H35372 Puckering of macula, left eye: Secondary | ICD-10-CM | POA: Diagnosis not present

## 2020-08-09 DIAGNOSIS — H401134 Primary open-angle glaucoma, bilateral, indeterminate stage: Secondary | ICD-10-CM | POA: Diagnosis not present

## 2020-08-13 DIAGNOSIS — Z23 Encounter for immunization: Secondary | ICD-10-CM | POA: Diagnosis not present

## 2020-08-27 ENCOUNTER — Other Ambulatory Visit (HOSPITAL_COMMUNITY): Payer: Self-pay | Admitting: Internal Medicine

## 2020-08-27 DIAGNOSIS — Z1231 Encounter for screening mammogram for malignant neoplasm of breast: Secondary | ICD-10-CM

## 2020-09-05 ENCOUNTER — Ambulatory Visit (HOSPITAL_COMMUNITY): Payer: Medicare Other

## 2020-09-09 ENCOUNTER — Other Ambulatory Visit: Payer: Self-pay

## 2020-09-09 ENCOUNTER — Ambulatory Visit (HOSPITAL_COMMUNITY)
Admission: RE | Admit: 2020-09-09 | Discharge: 2020-09-09 | Disposition: A | Discharge status: Home / Self Care | Payer: Medicare Other | Source: Ambulatory Visit | Attending: Internal Medicine | Admitting: Internal Medicine

## 2020-09-09 DIAGNOSIS — Z1231 Encounter for screening mammogram for malignant neoplasm of breast: Secondary | ICD-10-CM | POA: Diagnosis not present

## 2020-11-25 DIAGNOSIS — I10 Essential (primary) hypertension: Secondary | ICD-10-CM | POA: Diagnosis not present

## 2020-11-25 DIAGNOSIS — E1129 Type 2 diabetes mellitus with other diabetic kidney complication: Secondary | ICD-10-CM | POA: Diagnosis not present

## 2020-11-25 DIAGNOSIS — E669 Obesity, unspecified: Secondary | ICD-10-CM | POA: Diagnosis not present

## 2020-11-25 DIAGNOSIS — G43909 Migraine, unspecified, not intractable, without status migrainosus: Secondary | ICD-10-CM | POA: Diagnosis not present

## 2020-11-25 DIAGNOSIS — Z79899 Other long term (current) drug therapy: Secondary | ICD-10-CM | POA: Diagnosis not present

## 2020-11-25 DIAGNOSIS — E785 Hyperlipidemia, unspecified: Secondary | ICD-10-CM | POA: Diagnosis not present

## 2020-12-02 DIAGNOSIS — E1122 Type 2 diabetes mellitus with diabetic chronic kidney disease: Secondary | ICD-10-CM | POA: Diagnosis not present

## 2020-12-02 DIAGNOSIS — E785 Hyperlipidemia, unspecified: Secondary | ICD-10-CM | POA: Diagnosis not present

## 2020-12-02 DIAGNOSIS — I10 Essential (primary) hypertension: Secondary | ICD-10-CM | POA: Diagnosis not present

## 2020-12-27 ENCOUNTER — Encounter (HOSPITAL_COMMUNITY): Payer: Self-pay | Admitting: *Deleted

## 2020-12-27 ENCOUNTER — Other Ambulatory Visit: Payer: Self-pay

## 2020-12-27 ENCOUNTER — Emergency Department (HOSPITAL_COMMUNITY): Payer: Medicare Other

## 2020-12-27 ENCOUNTER — Emergency Department (HOSPITAL_COMMUNITY)
Admission: EM | Admit: 2020-12-27 | Discharge: 2020-12-27 | Disposition: A | Discharge status: Home / Self Care | Payer: Medicare Other | Attending: Emergency Medicine | Admitting: Emergency Medicine

## 2020-12-27 DIAGNOSIS — R110 Nausea: Secondary | ICD-10-CM | POA: Insufficient documentation

## 2020-12-27 DIAGNOSIS — R42 Dizziness and giddiness: Secondary | ICD-10-CM | POA: Diagnosis not present

## 2020-12-27 DIAGNOSIS — R531 Weakness: Secondary | ICD-10-CM | POA: Diagnosis not present

## 2020-12-27 DIAGNOSIS — Z7982 Long term (current) use of aspirin: Secondary | ICD-10-CM | POA: Diagnosis not present

## 2020-12-27 DIAGNOSIS — M7989 Other specified soft tissue disorders: Secondary | ICD-10-CM | POA: Diagnosis not present

## 2020-12-27 DIAGNOSIS — W19XXXA Unspecified fall, initial encounter: Secondary | ICD-10-CM | POA: Insufficient documentation

## 2020-12-27 DIAGNOSIS — Z7984 Long term (current) use of oral hypoglycemic drugs: Secondary | ICD-10-CM | POA: Insufficient documentation

## 2020-12-27 DIAGNOSIS — S99921A Unspecified injury of right foot, initial encounter: Secondary | ICD-10-CM | POA: Diagnosis present

## 2020-12-27 DIAGNOSIS — S92324A Nondisplaced fracture of second metatarsal bone, right foot, initial encounter for closed fracture: Secondary | ICD-10-CM | POA: Diagnosis not present

## 2020-12-27 DIAGNOSIS — R0902 Hypoxemia: Secondary | ICD-10-CM | POA: Diagnosis not present

## 2020-12-27 DIAGNOSIS — Y9301 Activity, walking, marching and hiking: Secondary | ICD-10-CM | POA: Diagnosis not present

## 2020-12-27 DIAGNOSIS — E119 Type 2 diabetes mellitus without complications: Secondary | ICD-10-CM | POA: Insufficient documentation

## 2020-12-27 DIAGNOSIS — Z79899 Other long term (current) drug therapy: Secondary | ICD-10-CM | POA: Insufficient documentation

## 2020-12-27 DIAGNOSIS — R61 Generalized hyperhidrosis: Secondary | ICD-10-CM | POA: Diagnosis not present

## 2020-12-27 DIAGNOSIS — R404 Transient alteration of awareness: Secondary | ICD-10-CM | POA: Diagnosis not present

## 2020-12-27 DIAGNOSIS — I10 Essential (primary) hypertension: Secondary | ICD-10-CM | POA: Insufficient documentation

## 2020-12-27 DIAGNOSIS — Y92512 Supermarket, store or market as the place of occurrence of the external cause: Secondary | ICD-10-CM | POA: Diagnosis not present

## 2020-12-27 DIAGNOSIS — I959 Hypotension, unspecified: Secondary | ICD-10-CM | POA: Diagnosis not present

## 2020-12-27 DIAGNOSIS — R52 Pain, unspecified: Secondary | ICD-10-CM | POA: Diagnosis not present

## 2020-12-27 MED ORDER — ONDANSETRON 4 MG PO TBDP
4.0000 mg | ORAL_TABLET | Freq: Once | ORAL | Status: AC
  Administered 2020-12-27: 4 mg via ORAL
  Filled 2020-12-27: qty 1

## 2020-12-27 MED ORDER — OXYCODONE-ACETAMINOPHEN 5-325 MG PO TABS
1.0000 | ORAL_TABLET | Freq: Once | ORAL | Status: AC
  Administered 2020-12-27: 1 via ORAL
  Filled 2020-12-27: qty 1

## 2020-12-27 MED ORDER — ONDANSETRON 4 MG PO TBDP: 4.0000 mg | ORAL_TABLET | Freq: Three times a day (TID) | ORAL | 0 refills | Status: DC | PRN

## 2020-12-27 NOTE — ED Triage Notes (Signed)
States she felt dizzy yesterday and she got her foot caught in the shopping cart, pain in right foot

## 2020-12-27 NOTE — ED Provider Notes (Signed)
[facility] EMERGENCY DEPARTMENT Provider Note   CSN: [id number] Arrival date & time: 12/27/20  1129     History Chief Complaint  Patient presents with   Lytle Michaels    Kimberly Miranda is a 80 y.o. female who presents to the emergency department after a fall that occurred 1 day ago.  She states that she was walking in [place] when she became lightheaded and sweaty.  She felt like she was going to pass out and sat down on her butt.  Her right foot got caught in the wheel which resulted in pain and swelling.  She does not recall losing consciousness.  She did not hit her head and she is not on anticoagulation.  She reports associated swelling and bruising to the right foot that is worse with ambulation.  She rates her foot pain a 10/10 in severity.  Has not taken anything for the pain.  Denies any knee pain or hip pain.   Fall Pertinent negatives include no chest pain and no shortness of breath.      Past Medical History:  Diagnosis Date   Diabetes ([place])    diet controlled since 2014   Edema    GERD (gastroesophageal reflux disease)    High cholesterol    Hypertension    Migraine without aura, with intractable migraine, so stated, without mention of status migrainosus 08/31/2013   Nephrolithiasis    Other specified cardiac dysrhythmias(427.89)    Vitiligo     Patient Active Problem List   Diagnosis Date Noted   Acute bronchitis 06/04/2017   Respiratory distress    Hypokalemia    Chest pain 01/07/2017   Essential hypertension 01/07/2017   Diabetes mellitus type 2 in obese ([place]) 01/07/2017   Hyperlipidemia 01/07/2017   Schatzki's ring    Reflux esophagitis    Hiatal hernia    Diverticulosis of colon without hemorrhage    History of colonic polyps    Family history of colon cancer 05/09/2014   Dysphagia, pharyngoesophageal phase 05/09/2014   Tubular adenoma of colon 05/09/2014   Common migraine with intractable migraine 08/31/2013    Past Surgical History:  Procedure  Laterality Date   ABDOMINAL HYSTERECTOMY     BACK SURGERY     COLONOSCOPY  2011   Dr. Arnoldo Morale: sessile polyp transverse colon (tubular adenoma)    COLONOSCOPY N/A 05/30/2014   Dr. Rourk:multiple colonic polyps/colonic diverticulosis/internal hemorrhoids. Tubular adenomas. Surveillance in 5 years if health permits.    COLONOSCOPY N/A 12/13/2019   Procedure: COLONOSCOPY;  Surgeon: Daneil Dolin, MD;  Location: [facility];  Service: Endoscopy;  Laterality: N/A;  9:15am   ESOPHAGOGASTRODUODENOSCOPY N/A 05/30/2014   Dr. Rourk:schatzki's ring with superimposed erosive reflux esophagitis, s/p dilation. hiatal hernia   MALONEY DILATION N/A 05/30/2014   Procedure: Venia Minks DILATION;  Surgeon: Daneil Dolin, MD;  Location: [facility];  Service: Endoscopy;  Laterality: N/A;   NASAL SINUS SURGERY     POLYPECTOMY  12/13/2019   Procedure: POLYPECTOMY;  Surgeon: Daneil Dolin, MD;  Location: [facility];  Service: Endoscopy;;   TONSILLECTOMY       OB History   No obstetric history on file.     Family History  Problem Relation Age of Onset   Hypertension Mother    Cancer - Colon Mother        at age greater than 90   Hypertension Father    Colon cancer Father        at age greater  than 80    Social History   Tobacco Use   Smoking status: Never   Smokeless tobacco: Never  Vaping Use   Vaping Use: Never used  Substance Use Topics   Alcohol use: Not Currently    Alcohol/week: 0.0 standard drinks    Comment: rare use   Drug use: No    Home Medications Prior to Admission medications   Medication Sig Start Date End Date Taking? Authorizing Provider  aspirin EC 81 MG tablet Take 81 mg by mouth daily.    [provider]  estradiol (ESTRACE) 0.1 MG/GM vaginal cream Place 1 Applicatorful vaginally 2 (two) times a week.    [provider]  furosemide (LASIX) 40 MG tablet Take 20 mg by mouth daily as needed for edema.     [provider]   hydrochlorothiazide (HYDRODIURIL) 25 MG tablet Take 25 mg by mouth daily. 08/15/13   [provider]  metFORMIN (GLUCOPHAGE) 500 MG tablet Take 500 mg by mouth 2 (two) times daily with a meal.  09/06/18   [provider]  metoprolol succinate (TOPROL-XL) 25 MG 24 hr tablet Take 25 mg by mouth daily. 11/11/19   [provider]  pantoprazole (PROTONIX) 40 MG tablet Take 40 mg by mouth daily.    [provider]  ramipril (ALTACE) 10 MG capsule Take 2 capsules (20 mg total) by mouth daily. Patient taking differently: Take 10 mg by mouth daily.  01/08/17   Isaac Bliss, Rayford Halsted, MD  simvastatin (ZOCOR) 20 MG tablet Take 20 mg by mouth daily.     [provider]  spironolactone (ALDACTONE) 25 MG tablet Take 25 mg by mouth daily.  09/06/18   [provider]    Allergies    Patient has no known allergies.  Review of Systems   Review of Systems  Respiratory:  Negative for chest tightness and shortness of breath.   Cardiovascular:  Negative for chest pain.  Musculoskeletal:        Right foot pain.  Right ankle pain.  No hip pain.  No knee pain.  Neurological:  Positive for light-headedness. Negative for dizziness, weakness and numbness.  All other systems reviewed and are negative.  Physical Exam Updated Vital Signs BP 129/60 (BP Location: Left Wrist)   Pulse 62   Temp 98 F (36.7 C) (Oral)   Resp 16   Ht '5\' 3"'$  (1.6 m)   Wt 97.5 kg   SpO2 100%   BMI 38.09 kg/m   Physical Exam Constitutional:      Appearance: Normal appearance.  HENT:     Head: Normocephalic and atraumatic.  Eyes:     General:        Right eye: No discharge.        Left eye: No discharge.  Cardiovascular:     Rate and Rhythm: Normal rate.     Pulses:          Radial pulses are 2+ on the right side and 2+ on the left side.       Dorsalis pedis pulses are 2+ on the right side and 2+ on the left side.  Pulmonary:     Effort: Pulmonary effort is normal.      Breath sounds: Normal breath sounds.  Abdominal:     General: Abdomen is flat.  Musculoskeletal:     Right lower leg: No edema.     Left lower leg: No edema.     Comments: There is tenderness, swelling,  and ecchymosis to the right metatarsals.  Right ankle is nontender to palpation.  Limited range of motion secondary to pain to the right metatarsals and ankle.  Normal sensation.  2+ dorsalis pedis pulse on the right.  Neurological:     Mental Status: She is alert.    ED Results / Procedures / Treatments   Labs (all labs ordered are listed, but only abnormal results are displayed) Labs Reviewed - No data to display  EKG None  Radiology DG Ankle Complete Right  Result Date: 12/27/2020 CLINICAL DATA:  Fall yesterday.  Foot pain EXAM: RIGHT ANKLE - COMPLETE 3+ VIEW COMPARISON:  None. FINDINGS: Normal ankle joint space.  Negative for fracture.  No arthropathy. Early arterial calcification. Calcification in the Achilles tendon. Diffuse soft tissue swelling. IMPRESSION: Negative for fracture Electronically Signed   By: Franchot Gallo M.D.   On: 12/27/2020 13:32   DG Foot Complete Right  Result Date: 12/27/2020 CLINICAL DATA:  Fall yesterday.  Foot pain EXAM: RIGHT FOOT COMPLETE - 3+ VIEW COMPARISON:  None. FINDINGS: Nondisplaced transverse fracture base of second metatarsal. No other fracture identified. Soft tissue calcification medial to the first MTP joint. No erosion or joint space narrowing. Possible chronic bursitis. Gouty tophus possible however no erosion or arthropathy identified. Calcification in the Achilles tendon. Mild arterial calcification. IMPRESSION: Nondisplaced fracture base of second metatarsal. Electronically Signed   By: Franchot Gallo M.D.   On: 12/27/2020 13:31    Procedures Procedures   Medications Ordered in ED Medications  oxyCODONE-acetaminophen (PERCOCET/ROXICET) 5-325 MG per tablet 1 tablet (has no administration in time range)    ED Course  I have reviewed  the triage vital signs and the nursing notes.  Pertinent labs & imaging results that were available during my care of the patient were reviewed by me and considered in my medical decision making (see chart for details).  Clinical Course as of 12/27/20 1617  Fri Dec 27, 2020  1236 EKG field normal sinus rhythm.  No signs of arrhythmia.  No ST elevations or other signs of ischemia. [CF]  1328 Imaging of the right foot revealed questionable fracture of the second metatarsal.  Will wait for radiologist interpretation. [CF]  1331 Imaging of the right ankle revealed no obvious signs of fracture or dislocation.  Will wait for radiologist interpretation. [CF]    Clinical Course User Index [CF] Cherrie Gauze   MDM Rules/Calculators/A&P                          Chantee Mummey is a 80 year old female who presents to the emergency department for right foot pain after a subsequent fall that occurred yesterday.  Clinically she appears well.  Compartments are soft.  There is moderate amount of swelling and ecchymosis to the dorsum of the right foot.  She has normal range of motion to the right toes.  Normal range of motion in the right ankle.  She is neurovascularly intact distal to the injury.  Difficult to assess cap refill as she has her toenails painted.  EKG was negative for any arrhythmia or ACS.  Orthostatics was negative.  Her near syncope is less likely to be cardiac.  Unclear on the specific cause.  Region of the right ankle did not reveal any fracture or displacement.  There is a right second nondisplaced metatarsal fracture on foot imaging.  The clinical picture her pain was adequately controlled.  Will have her placed in a  cam boot.  Tylenol and ibuprofen as needed for pain.  We will have her follow-up with orthopedics for further evaluation.  Strict return precautions given.  She is safe for discharge.  Patient called EMS secondary to nausea and general weakness when she got home likely  secondary to her percocet given in the department.  Verbal for Zofran was given to EMS.  I have also written a prescription for Zofran for her nausea. Final Clinical Impression(s) / ED Diagnoses Final diagnoses:  Nondisplaced fracture of second metatarsal bone, right foot, initial encounter for closed fracture    Rx / DC Orders ED Discharge Orders     None        Cherrie Gauze 12/27/20 1619    Davonna Belling, MD 12/28/20 [phone]

## 2020-12-27 NOTE — Discharge Instructions (Addendum)
You were seen and evaluated in the emergency department today for a foot injury after a fall.  As we discussed, you fractured your right second toe in your foot.  Tylenol/ibuprofen as needed for pain.  Be sure to stay off it is much as you can and ice.  Follow-up with your orthopedist.  Please return to the emergency department if you experience worsening pain, worsening soft tissue swelling, discoloration to your toes, or decreased temperature to your foot. Thank you for letting me participate in your care today.

## 2021-01-03 DIAGNOSIS — S92321D Displaced fracture of second metatarsal bone, right foot, subsequent encounter for fracture with routine healing: Secondary | ICD-10-CM | POA: Diagnosis not present

## 2021-01-31 DIAGNOSIS — S92321D Displaced fracture of second metatarsal bone, right foot, subsequent encounter for fracture with routine healing: Secondary | ICD-10-CM | POA: Diagnosis not present

## 2021-01-31 DIAGNOSIS — M170 Bilateral primary osteoarthritis of knee: Secondary | ICD-10-CM | POA: Diagnosis not present

## 2021-03-31 DIAGNOSIS — Z79899 Other long term (current) drug therapy: Secondary | ICD-10-CM | POA: Diagnosis not present

## 2021-03-31 DIAGNOSIS — I10 Essential (primary) hypertension: Secondary | ICD-10-CM | POA: Diagnosis not present

## 2021-03-31 DIAGNOSIS — N1831 Chronic kidney disease, stage 3a: Secondary | ICD-10-CM | POA: Diagnosis not present

## 2021-03-31 DIAGNOSIS — E785 Hyperlipidemia, unspecified: Secondary | ICD-10-CM | POA: Diagnosis not present

## 2021-03-31 DIAGNOSIS — E1129 Type 2 diabetes mellitus with other diabetic kidney complication: Secondary | ICD-10-CM | POA: Diagnosis not present

## 2021-04-03 DIAGNOSIS — R7309 Other abnormal glucose: Secondary | ICD-10-CM | POA: Diagnosis not present

## 2021-04-03 DIAGNOSIS — N1831 Chronic kidney disease, stage 3a: Secondary | ICD-10-CM | POA: Diagnosis not present

## 2021-04-03 DIAGNOSIS — I10 Essential (primary) hypertension: Secondary | ICD-10-CM | POA: Diagnosis not present

## 2021-04-03 DIAGNOSIS — E1122 Type 2 diabetes mellitus with diabetic chronic kidney disease: Secondary | ICD-10-CM | POA: Diagnosis not present

## 2021-07-01 DIAGNOSIS — I10 Essential (primary) hypertension: Secondary | ICD-10-CM | POA: Diagnosis not present

## 2021-07-01 DIAGNOSIS — E785 Hyperlipidemia, unspecified: Secondary | ICD-10-CM | POA: Diagnosis not present

## 2021-07-11 DIAGNOSIS — N952 Postmenopausal atrophic vaginitis: Secondary | ICD-10-CM | POA: Diagnosis not present

## 2021-07-11 DIAGNOSIS — N3946 Mixed incontinence: Secondary | ICD-10-CM | POA: Diagnosis not present

## 2021-07-11 DIAGNOSIS — N3020 Other chronic cystitis without hematuria: Secondary | ICD-10-CM | POA: Diagnosis not present

## 2021-07-28 DIAGNOSIS — G43909 Migraine, unspecified, not intractable, without status migrainosus: Secondary | ICD-10-CM | POA: Diagnosis not present

## 2021-07-28 DIAGNOSIS — E785 Hyperlipidemia, unspecified: Secondary | ICD-10-CM | POA: Diagnosis not present

## 2021-07-28 DIAGNOSIS — E1129 Type 2 diabetes mellitus with other diabetic kidney complication: Secondary | ICD-10-CM | POA: Diagnosis not present

## 2021-07-28 DIAGNOSIS — Z79899 Other long term (current) drug therapy: Secondary | ICD-10-CM | POA: Diagnosis not present

## 2021-07-28 DIAGNOSIS — R609 Edema, unspecified: Secondary | ICD-10-CM | POA: Diagnosis not present

## 2021-07-28 DIAGNOSIS — I10 Essential (primary) hypertension: Secondary | ICD-10-CM | POA: Diagnosis not present

## 2021-08-01 IMAGING — MG MM DIGITAL SCREENING BILAT W/ TOMO AND CAD
6 of 10 series · 6 of 30 positions shown · non-contrast
Comparison: Previous exam(s).

CLINICAL DATA: Screening.

EXAM:
DIGITAL SCREENING BILATERAL MAMMOGRAM WITH TOMOSYNTHESIS AND CAD
TECHNIQUE: Bilateral screening digital craniocaudal and mediolateral oblique
mammograms were obtained. Bilateral screening digital breast
tomosynthesis was performed. The images were evaluated with
computer-aided detection.

[L MLO synth-2D]
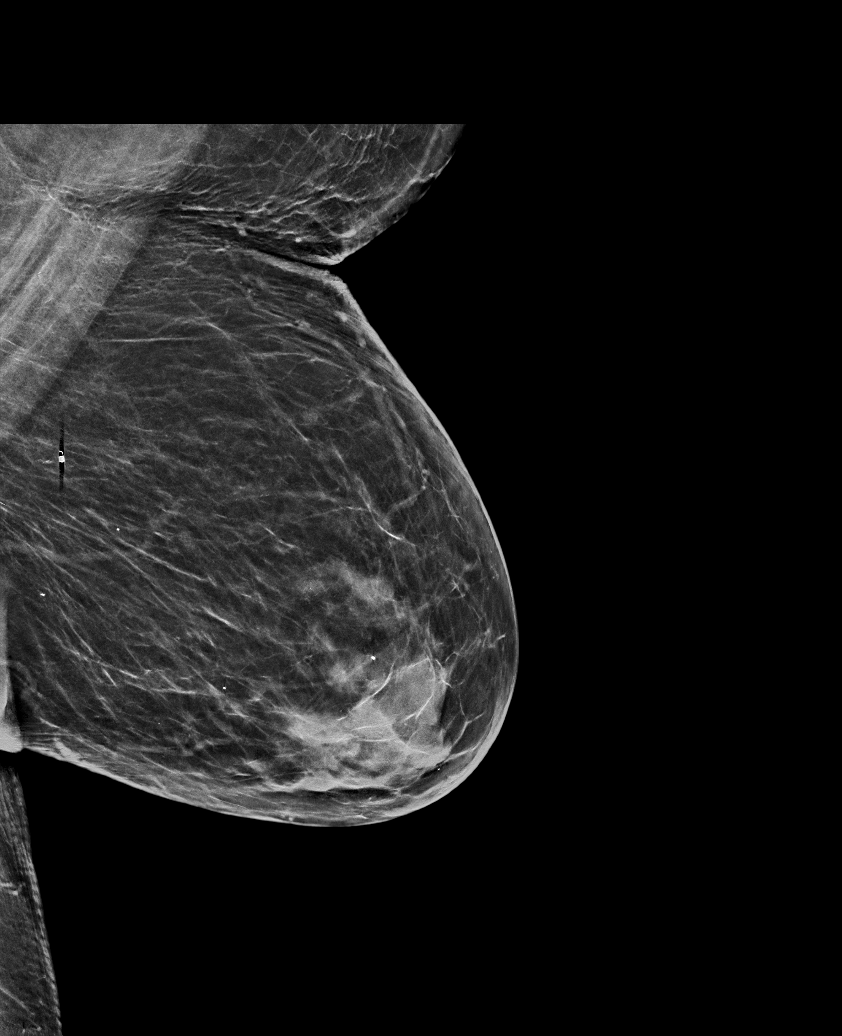

[R CC synth-2D]
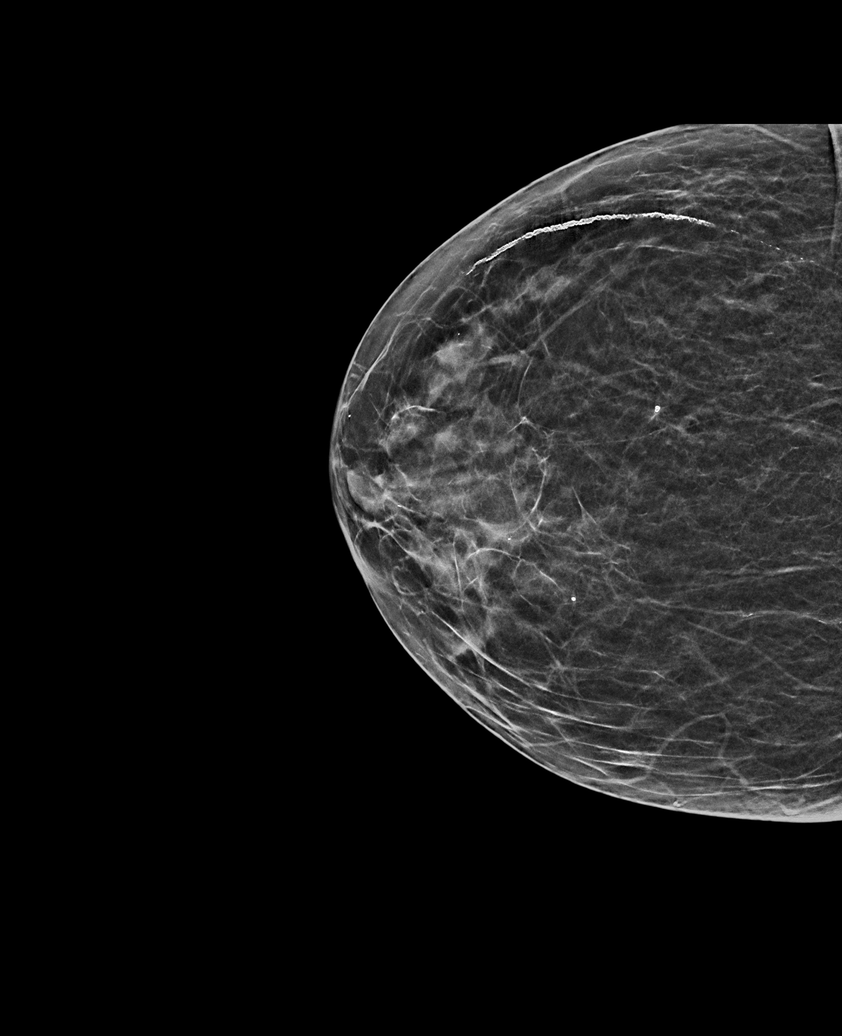

[R MLO synth-2D]
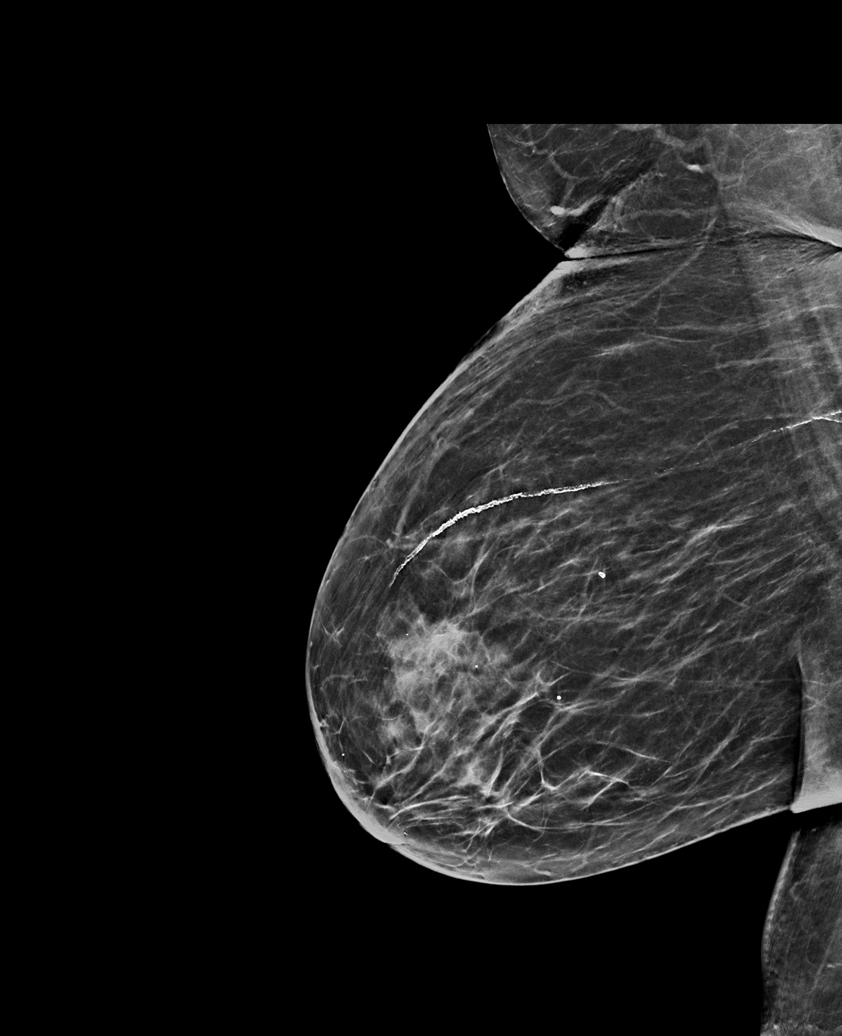

[L CC synth-2D (1 of 2)]
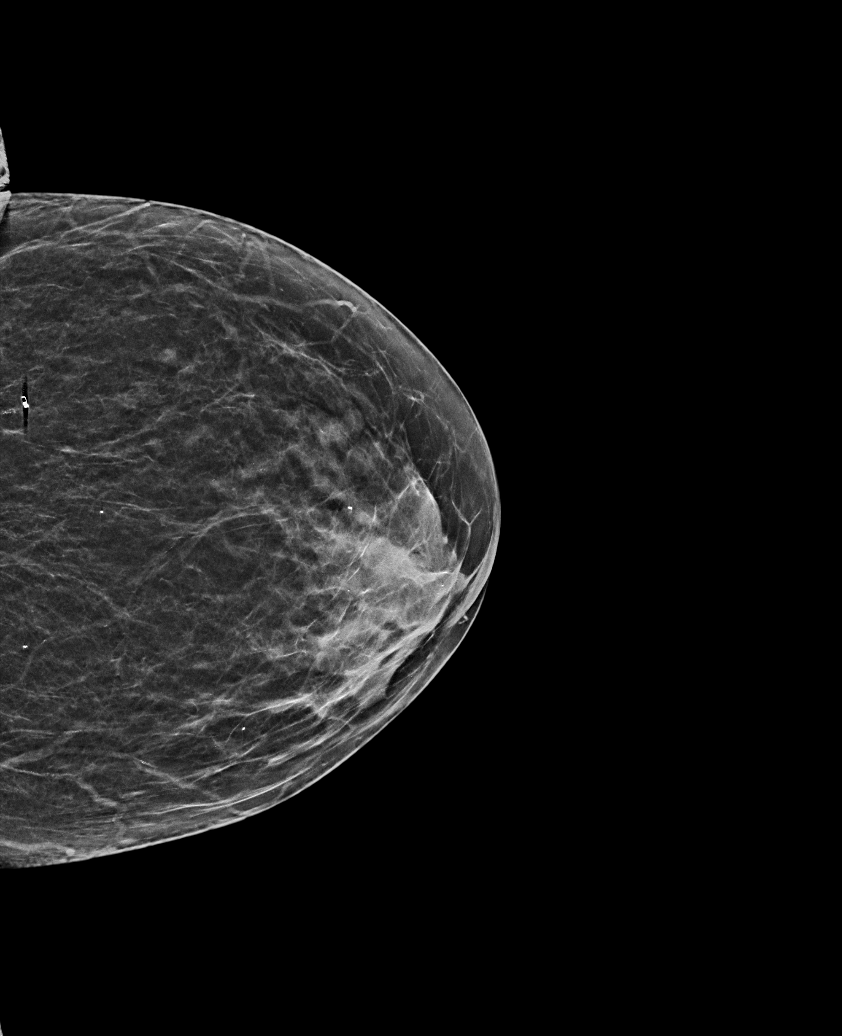

[L CC synth-2D (2 of 2)]
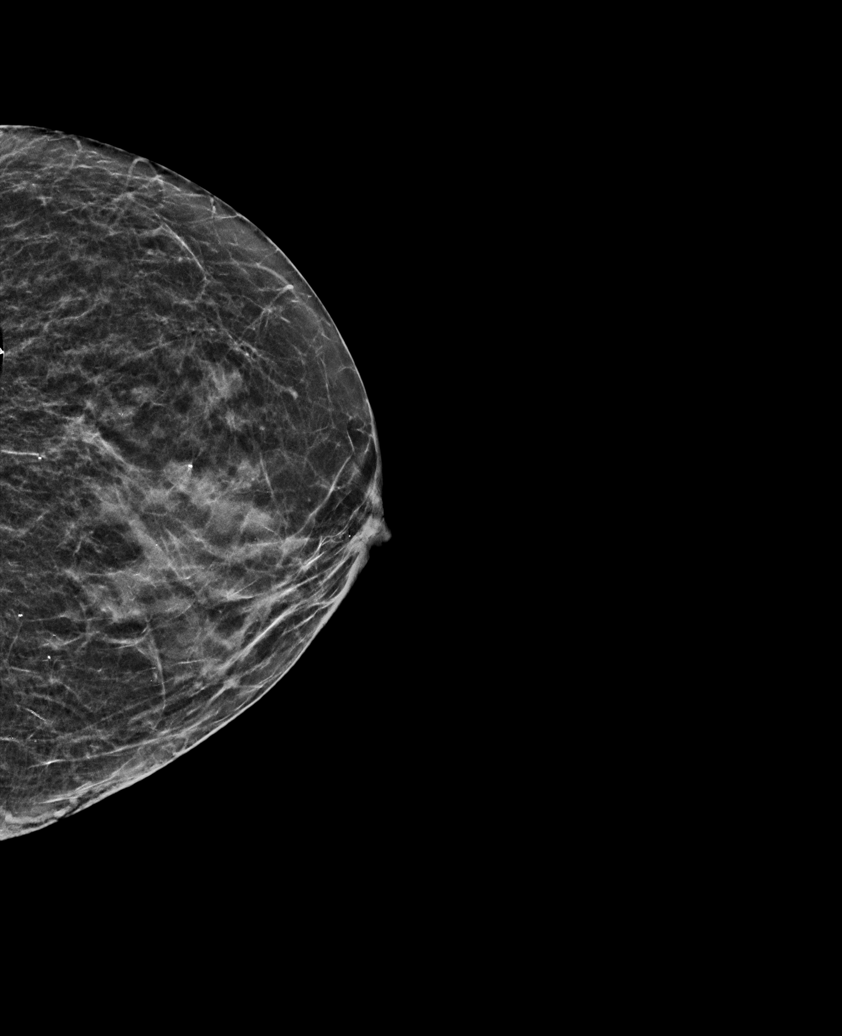

[L MLO tomo · tomo slice 31/61.0]
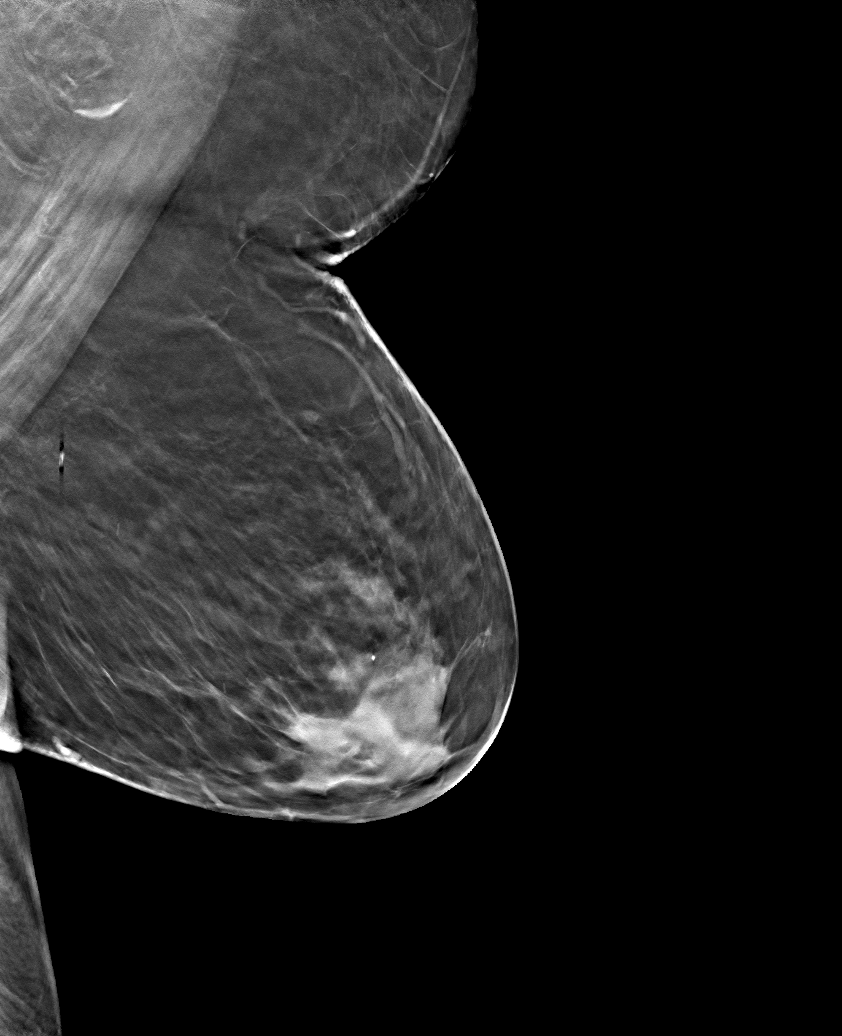

[6 of 30 positions shown; findings below may reference images not displayed]

ACR Breast Density Category c: The breast tissue is heterogeneously
dense, which may obscure small masses.
FINDINGS: There are no findings suspicious for malignancy. The images were
evaluated with computer-aided detection.
IMPRESSION: No mammographic evidence of malignancy. A result letter of this
screening mammogram will be mailed directly to the patient.

RECOMMENDATION:
Screening mammogram in one year. (Code:T4-5-GWO)

BI-RADS CATEGORY  1: Negative.

## 2021-08-04 DIAGNOSIS — N1831 Chronic kidney disease, stage 3a: Secondary | ICD-10-CM | POA: Diagnosis not present

## 2021-08-04 DIAGNOSIS — E785 Hyperlipidemia, unspecified: Secondary | ICD-10-CM | POA: Diagnosis not present

## 2021-08-04 DIAGNOSIS — R001 Bradycardia, unspecified: Secondary | ICD-10-CM | POA: Diagnosis not present

## 2021-08-04 DIAGNOSIS — I10 Essential (primary) hypertension: Secondary | ICD-10-CM | POA: Diagnosis not present

## 2021-08-04 DIAGNOSIS — Z6839 Body mass index (BMI) 39.0-39.9, adult: Secondary | ICD-10-CM | POA: Diagnosis not present

## 2021-08-04 DIAGNOSIS — E1122 Type 2 diabetes mellitus with diabetic chronic kidney disease: Secondary | ICD-10-CM | POA: Diagnosis not present

## 2021-08-04 DIAGNOSIS — R7309 Other abnormal glucose: Secondary | ICD-10-CM | POA: Diagnosis not present

## 2021-09-18 DIAGNOSIS — R8279 Other abnormal findings on microbiological examination of urine: Secondary | ICD-10-CM | POA: Diagnosis not present

## 2021-09-18 DIAGNOSIS — N3000 Acute cystitis without hematuria: Secondary | ICD-10-CM | POA: Diagnosis not present

## 2021-10-07 DIAGNOSIS — N3000 Acute cystitis without hematuria: Secondary | ICD-10-CM | POA: Diagnosis not present

## 2021-10-07 DIAGNOSIS — R8271 Bacteriuria: Secondary | ICD-10-CM | POA: Diagnosis not present

## 2021-10-23 ENCOUNTER — Ambulatory Visit (HOSPITAL_COMMUNITY)
Admission: RE | Admit: 2021-10-23 | Discharge: 2021-10-23 | Disposition: A | Discharge status: Home / Self Care | Payer: Medicare Other | Source: Ambulatory Visit | Attending: Internal Medicine | Admitting: Internal Medicine

## 2021-10-23 ENCOUNTER — Other Ambulatory Visit (HOSPITAL_COMMUNITY): Payer: Self-pay | Admitting: Internal Medicine

## 2021-10-23 DIAGNOSIS — R059 Cough, unspecified: Secondary | ICD-10-CM

## 2021-10-23 DIAGNOSIS — Z1231 Encounter for screening mammogram for malignant neoplasm of breast: Secondary | ICD-10-CM

## 2021-10-23 DIAGNOSIS — R0602 Shortness of breath: Secondary | ICD-10-CM | POA: Insufficient documentation

## 2021-10-29 ENCOUNTER — Ambulatory Visit (HOSPITAL_COMMUNITY)
Admission: RE | Admit: 2021-10-29 | Discharge: 2021-10-29 | Disposition: A | Discharge status: Home / Self Care | Payer: Medicare Other | Source: Ambulatory Visit | Attending: Internal Medicine | Admitting: Internal Medicine

## 2021-10-29 DIAGNOSIS — Z1231 Encounter for screening mammogram for malignant neoplasm of breast: Secondary | ICD-10-CM | POA: Insufficient documentation

## 2021-11-05 ENCOUNTER — Other Ambulatory Visit (HOSPITAL_COMMUNITY): Payer: Self-pay | Admitting: Internal Medicine

## 2021-11-05 DIAGNOSIS — R928 Other abnormal and inconclusive findings on diagnostic imaging of breast: Secondary | ICD-10-CM

## 2021-11-25 ENCOUNTER — Ambulatory Visit (HOSPITAL_COMMUNITY)
Admission: RE | Admit: 2021-11-25 | Discharge: 2021-11-25 | Disposition: A | Discharge status: Home / Self Care | Payer: Medicare Other | Source: Ambulatory Visit | Attending: Internal Medicine | Admitting: Internal Medicine

## 2021-11-25 DIAGNOSIS — R928 Other abnormal and inconclusive findings on diagnostic imaging of breast: Secondary | ICD-10-CM | POA: Insufficient documentation

## 2021-11-25 DIAGNOSIS — R921 Mammographic calcification found on diagnostic imaging of breast: Secondary | ICD-10-CM | POA: Diagnosis not present

## 2021-11-27 DIAGNOSIS — E1129 Type 2 diabetes mellitus with other diabetic kidney complication: Secondary | ICD-10-CM | POA: Diagnosis not present

## 2021-12-04 DIAGNOSIS — E1129 Type 2 diabetes mellitus with other diabetic kidney complication: Secondary | ICD-10-CM | POA: Diagnosis not present

## 2021-12-04 DIAGNOSIS — I10 Essential (primary) hypertension: Secondary | ICD-10-CM | POA: Diagnosis not present

## 2021-12-04 DIAGNOSIS — R739 Hyperglycemia, unspecified: Secondary | ICD-10-CM | POA: Diagnosis not present

## 2022-02-09 DIAGNOSIS — M19071 Primary osteoarthritis, right ankle and foot: Secondary | ICD-10-CM | POA: Diagnosis not present

## 2022-02-23 DIAGNOSIS — M19071 Primary osteoarthritis, right ankle and foot: Secondary | ICD-10-CM | POA: Diagnosis not present

## 2022-02-24 DIAGNOSIS — N3000 Acute cystitis without hematuria: Secondary | ICD-10-CM | POA: Diagnosis not present

## 2022-02-24 DIAGNOSIS — R8271 Bacteriuria: Secondary | ICD-10-CM | POA: Diagnosis not present

## 2022-04-02 DIAGNOSIS — Z23 Encounter for immunization: Secondary | ICD-10-CM | POA: Diagnosis not present

## 2022-04-03 DIAGNOSIS — E1129 Type 2 diabetes mellitus with other diabetic kidney complication: Secondary | ICD-10-CM | POA: Diagnosis not present

## 2022-04-10 DIAGNOSIS — R7309 Other abnormal glucose: Secondary | ICD-10-CM | POA: Diagnosis not present

## 2022-04-10 DIAGNOSIS — I10 Essential (primary) hypertension: Secondary | ICD-10-CM | POA: Diagnosis not present

## 2022-04-10 DIAGNOSIS — E1122 Type 2 diabetes mellitus with diabetic chronic kidney disease: Secondary | ICD-10-CM | POA: Diagnosis not present

## 2022-04-10 DIAGNOSIS — R051 Acute cough: Secondary | ICD-10-CM | POA: Diagnosis not present

## 2022-04-16 DIAGNOSIS — Z23 Encounter for immunization: Secondary | ICD-10-CM | POA: Diagnosis not present

## 2022-06-14 ENCOUNTER — Encounter (HOSPITAL_COMMUNITY): Payer: Self-pay

## 2022-06-14 ENCOUNTER — Emergency Department (HOSPITAL_COMMUNITY)
Admission: EM | Admit: 2022-06-14 | Discharge: 2022-06-14 | Disposition: A | Discharge status: Home / Self Care | Payer: Medicare Other | Attending: Emergency Medicine | Admitting: Emergency Medicine

## 2022-06-14 ENCOUNTER — Other Ambulatory Visit: Payer: Self-pay

## 2022-06-14 ENCOUNTER — Emergency Department (HOSPITAL_COMMUNITY): Payer: Medicare Other

## 2022-06-14 DIAGNOSIS — Z7982 Long term (current) use of aspirin: Secondary | ICD-10-CM | POA: Insufficient documentation

## 2022-06-14 DIAGNOSIS — R109 Unspecified abdominal pain: Secondary | ICD-10-CM | POA: Insufficient documentation

## 2022-06-14 DIAGNOSIS — R1111 Vomiting without nausea: Secondary | ICD-10-CM | POA: Diagnosis not present

## 2022-06-14 DIAGNOSIS — Z7984 Long term (current) use of oral hypoglycemic drugs: Secondary | ICD-10-CM | POA: Insufficient documentation

## 2022-06-14 DIAGNOSIS — Z1152 Encounter for screening for COVID-19: Secondary | ICD-10-CM | POA: Diagnosis not present

## 2022-06-14 DIAGNOSIS — I700 Atherosclerosis of aorta: Secondary | ICD-10-CM | POA: Diagnosis not present

## 2022-06-14 DIAGNOSIS — I10 Essential (primary) hypertension: Secondary | ICD-10-CM | POA: Diagnosis not present

## 2022-06-14 DIAGNOSIS — R112 Nausea with vomiting, unspecified: Secondary | ICD-10-CM | POA: Insufficient documentation

## 2022-06-14 DIAGNOSIS — E119 Type 2 diabetes mellitus without complications: Secondary | ICD-10-CM | POA: Diagnosis not present

## 2022-06-14 DIAGNOSIS — Z79899 Other long term (current) drug therapy: Secondary | ICD-10-CM | POA: Diagnosis not present

## 2022-06-14 DIAGNOSIS — R0902 Hypoxemia: Secondary | ICD-10-CM | POA: Diagnosis not present

## 2022-06-14 LAB — CBC WITH DIFFERENTIAL/PLATELET
Abs Immature Granulocytes: 0.05 10*3/uL (ref 0.00–0.07)
Basophils Absolute: 0 10*3/uL (ref 0.0–0.1)
Basophils Relative: 1 %
Eosinophils Absolute: 0.1 10*3/uL (ref 0.0–0.5)
Eosinophils Relative: 1 %
HCT: 46.4 % — ABNORMAL HIGH (ref 36.0–46.0)
Hemoglobin: 15.5 g/dL — ABNORMAL HIGH (ref 12.0–15.0)
Immature Granulocytes: 1 %
Lymphocytes Relative: 18 %
Lymphs Abs: 1.5 10*3/uL (ref 0.7–4.0)
MCH: 29.6 pg (ref 26.0–34.0)
MCHC: 33.4 g/dL (ref 30.0–36.0)
MCV: 88.5 fL (ref 80.0–100.0)
Monocytes Absolute: 0.3 10*3/uL (ref 0.1–1.0)
Monocytes Relative: 4 %
Neutro Abs: 6.3 10*3/uL (ref 1.7–7.7)
Neutrophils Relative %: 75 %
Platelets: 306 10*3/uL (ref 150–400)
RBC: 5.24 MIL/uL — ABNORMAL HIGH (ref 3.87–5.11)
RDW: 14.6 % (ref 11.5–15.5)
WBC: 8.3 10*3/uL (ref 4.0–10.5)
nRBC: 0 % (ref 0.0–0.2)

## 2022-06-14 LAB — RESP PANEL BY RT-PCR (RSV, FLU A&B, COVID)  RVPGX2
Influenza A by PCR: NEGATIVE
Influenza B by PCR: NEGATIVE
Resp Syncytial Virus by PCR: NEGATIVE
SARS Coronavirus 2 by RT PCR: NEGATIVE

## 2022-06-14 LAB — URINALYSIS, ROUTINE W REFLEX MICROSCOPIC
Bilirubin Urine: NEGATIVE
Glucose, UA: 50 mg/dL — AB
Hgb urine dipstick: NEGATIVE
Ketones, ur: NEGATIVE mg/dL
Nitrite: NEGATIVE
Protein, ur: NEGATIVE mg/dL
Specific Gravity, Urine: 1.025 (ref 1.005–1.030)
pH: 5 (ref 5.0–8.0)

## 2022-06-14 LAB — COMPREHENSIVE METABOLIC PANEL
ALT: 14 U/L (ref 0–44)
AST: 19 U/L (ref 15–41)
Albumin: 3.5 g/dL (ref 3.5–5.0)
Alkaline Phosphatase: 53 U/L (ref 38–126)
Anion gap: 10 (ref 5–15)
BUN: 19 mg/dL (ref 8–23)
CO2: 27 mmol/L (ref 22–32)
Calcium: 8.8 mg/dL — ABNORMAL LOW (ref 8.9–10.3)
Chloride: 99 mmol/L (ref 98–111)
Creatinine, Ser: 1.02 mg/dL — ABNORMAL HIGH (ref 0.44–1.00)
GFR, Estimated: 55 mL/min — ABNORMAL LOW (ref >60)
Glucose, Bld: 235 mg/dL — ABNORMAL HIGH (ref 70–99)
Potassium: 3.3 mmol/L — ABNORMAL LOW (ref 3.5–5.1)
Sodium: 136 mmol/L (ref 135–145)
Total Bilirubin: 0.7 mg/dL (ref 0.3–1.2)
Total Protein: 6.8 g/dL (ref 6.5–8.1)

## 2022-06-14 LAB — TROPONIN I (HIGH SENSITIVITY)
Troponin I (High Sensitivity): 4 ng/L (ref <18)
Troponin I (High Sensitivity): 5 ng/L (ref <18)

## 2022-06-14 LAB — LIPASE, BLOOD: Lipase: 35 U/L (ref 11–51)

## 2022-06-14 MED ORDER — IOHEXOL 300 MG/ML  SOLN
100.0000 mL | Freq: Once | INTRAMUSCULAR | Status: AC | PRN
  Administered 2022-06-14: 100 mL via INTRAVENOUS

## 2022-06-14 MED ORDER — ONDANSETRON 4 MG PO TBDP: 4.0000 mg | ORAL_TABLET | Freq: Three times a day (TID) | ORAL | 0 refills | Status: AC | PRN

## 2022-06-14 MED ORDER — SODIUM CHLORIDE 0.9 % IV BOLUS
1000.0000 mL | Freq: Once | INTRAVENOUS | Status: AC
  Administered 2022-06-14: 1000 mL via INTRAVENOUS

## 2022-06-14 MED ORDER — ONDANSETRON HCL 4 MG/2ML IJ SOLN
4.0000 mg | Freq: Once | INTRAMUSCULAR | Status: AC
  Administered 2022-06-14: 4 mg via INTRAVENOUS
  Filled 2022-06-14: qty 2

## 2022-06-14 NOTE — ED Notes (Signed)
Patient given water at this time for PO challenge.

## 2022-06-14 NOTE — ED Notes (Signed)
Patient given saltine crackers at this time. Patient drank 240 oz of water and states she feels better.

## 2022-06-14 NOTE — ED Notes (Addendum)
Patient oxygen saturation dropped to 84% room air. Pulse ox changed to a different finger. Patient repositioned and bed and instructed to take deep breathes. Oxygen saturation increased to 91% room air. Patient placed on 2L of oxygen oxygen saturation 96%. EDP notified.

## 2022-06-14 NOTE — Discharge Instructions (Signed)
Watch for fever or other signs of infection.  Try and keep yourself hydrated.

## 2022-06-14 NOTE — ED Triage Notes (Signed)
Pt arrived from home via [facility] who report Pt called them due to emesis that began at apprx 0030 this morning and Pt is concerned of dehydration. EMS report Pt experienced 3 episodes of emesis while in transport to [facility] for evaluation.

## 2022-06-14 NOTE — ED Provider Notes (Signed)
  Physical Exam  BP (!) 170/84   Pulse (!) 53   Temp 97.6 F (36.4 C) (Axillary) Comment: pt just drank water  Resp 14   Ht '5\' 3"'$  (1.6 m)   Wt 98 kg   SpO2 100%   BMI 38.27 kg/m   Physical Exam  Procedures  Procedures  ED Course / MDM    Medical Decision Making Amount and/or Complexity of Data Reviewed Labs: ordered. Radiology: ordered.  Risk Prescription drug management.   Received in signout.  Nausea and vomiting.  CT scan done and showed no obstruction.  Does have gallstones but not tender in this area.  Doubt biliary colic as a cause.  Did have brief episode of hypoxia.  However per patient reportedly was on a finger with nail polish and is improved.  Does not feel short of breath.  X-ray flu and COVID testing reassuring.  Does have some chronic urinary symptoms.  Reports on chronic antibiotics.  States does have some dysuria however this is really unchanged.  Urine is not clear for infection but culture will be sent.  If urine culture comes back positive I think results can be interpreted more by her PCP or urology depending on how she is doing at the time.       Davonna Belling, MD 06/14/22 1034

## 2022-06-14 NOTE — ED Provider Notes (Signed)
[facility] Provider Note   CSN: [id number] Arrival date & time: 06/14/22  0522     History  Chief Complaint  Patient presents with   Emesis    AMBI FEJES is a 82 y.o. female.  Is an 82 year old female with past medical history of diabetes, hypertension, hyperlipidemia.  Patient brought by EMS for evaluation of nausea and vomiting.  This started approximately midnight tonight.  She was feeling well before this, then suddenly began to have severe nausea and vomiting.  She has vomited multiple times.  She reports some abdominal cramping.  She denies fevers, chills, or diarrhea.  She denies any ill contacts or having consumed any suspicious foods.  The history is provided by the patient.       Home Medications Prior to Admission medications   Medication Sig Start Date End Date Taking? Authorizing Provider  aspirin EC 81 MG tablet Take 81 mg by mouth daily.    [provider]  estradiol (ESTRACE) 0.1 MG/GM vaginal cream Place 1 Applicatorful vaginally 2 (two) times a week.    [provider]  furosemide (LASIX) 40 MG tablet Take 20 mg by mouth daily as needed for edema.     [provider]  hydrochlorothiazide (HYDRODIURIL) 25 MG tablet Take 25 mg by mouth daily. 08/15/13   [provider]  metFORMIN (GLUCOPHAGE) 500 MG tablet Take 500 mg by mouth 2 (two) times daily with a meal.  09/06/18   [provider]  metoprolol succinate (TOPROL-XL) 25 MG 24 hr tablet Take 25 mg by mouth daily. 11/11/19   [provider]  ondansetron (ZOFRAN ODT) 4 MG disintegrating tablet Take 1 tablet (4 mg total) by mouth every 8 (eight) hours as needed for nausea or vomiting. 12/27/20   Myna Bright M, PA-C  pantoprazole (PROTONIX) 40 MG tablet Take 40 mg by mouth daily.    [provider]  ramipril (ALTACE) 10 MG capsule Take 2 capsules (20 mg total) by mouth daily. Patient taking differently: Take  10 mg by mouth daily.  01/08/17   Isaac Bliss, Rayford Halsted, MD  simvastatin (ZOCOR) 20 MG tablet Take 20 mg by mouth daily.     [provider]  spironolactone (ALDACTONE) 25 MG tablet Take 25 mg by mouth daily.  09/06/18   [provider]      Allergies    Patient has no known allergies.    Review of Systems   Review of Systems  All other systems reviewed and are negative.   Physical Exam Updated Vital Signs BP (!) 182/97 (BP Location: Left Arm)   Pulse 64   Temp (!) 97.4 F (36.3 C) (Oral)   Resp 16   Ht 5' 3"$  (1.6 m)   Wt 98 kg   SpO2 94%   BMI 38.27 kg/m  Physical Exam Vitals and nursing note reviewed.  Constitutional:      General: She is not in acute distress.    Appearance: She is well-developed. She is not diaphoretic.     Comments: Patient is an 82 year old female in no acute distress.  She appears uncomfortable and is actively vomiting.  HENT:     Head: Normocephalic and atraumatic.  Cardiovascular:     Rate and Rhythm: Normal rate and regular rhythm.     Heart sounds: No murmur heard.    No friction rub. No gallop.  Pulmonary:     Effort: Pulmonary effort is normal. No respiratory distress.  Breath sounds: Normal breath sounds. No wheezing.  Abdominal:     General: Bowel sounds are normal. There is no distension.     Palpations: Abdomen is soft.     Tenderness: There is abdominal tenderness. There is no right CVA tenderness, left CVA tenderness, guarding or rebound.     Comments: There is generalized abdominal tenderness, but no rebound or guarding.  Musculoskeletal:        General: Normal range of motion.     Cervical back: Normal range of motion and neck supple.  Skin:    General: Skin is warm and dry.  Neurological:     General: No focal deficit present.     Mental Status: She is alert and oriented to person, place, and time.     ED Results / Procedures / Treatments   Labs (all labs ordered are listed, but only abnormal  results are displayed) Labs Reviewed  COMPREHENSIVE METABOLIC PANEL  LIPASE, BLOOD  CBC WITH DIFFERENTIAL/PLATELET  URINALYSIS, ROUTINE W REFLEX MICROSCOPIC    EKG EKG Interpretation  Date/Time:  Sunday June 14 2022 05:51:46 EST Ventricular Rate:  60 PR Interval:  171 QRS Duration: 108 QT Interval:  463 QTC Calculation: 463 R Axis:   -19 Text Interpretation: Sinus rhythm Borderline left axis deviation Consider anterior infarct Baseline wander in lead(s) V5 Confirmed by Veryl Speak [phone]) on 06/14/2022 5:58:44 AM  Radiology No results found.  Procedures Procedures  {Document cardiac monitor, telemetry assessment procedure when appropriate:1}  Medications Ordered in ED Medications  ondansetron (ZOFRAN) injection 4 mg (has no administration in time range)  sodium chloride 0.9 % bolus 1,000 mL (has no administration in time range)    ED Course/ Medical Decision Making/ A&P   {   Click here for ABCD2, HEART and other calculatorsREFRESH Note before signing :1}                          Medical Decision Making Amount and/or Complexity of Data Reviewed Labs: ordered. Radiology: ordered.  Risk Prescription drug management.   ***  {Document critical care time when appropriate:1} {Document review of labs and clinical decision tools ie heart score, Chads2Vasc2 etc:1}  {Document your independent review of radiology images, and any outside records:1} {Document your discussion with family members, caretakers, and with consultants:1} {Document social determinants of health affecting pt's care:1} {Document your decision making why or why not admission, treatments were needed:1} Final Clinical Impression(s) / ED Diagnoses Final diagnoses:  None    Rx / DC Orders ED Discharge Orders     None

## 2022-06-14 NOTE — ED Notes (Signed)
Patient transported to CT 

## 2022-06-15 DIAGNOSIS — E119 Type 2 diabetes mellitus without complications: Secondary | ICD-10-CM | POA: Diagnosis not present

## 2022-06-15 DIAGNOSIS — M545 Low back pain, unspecified: Secondary | ICD-10-CM | POA: Diagnosis not present

## 2022-06-15 DIAGNOSIS — M170 Bilateral primary osteoarthritis of knee: Secondary | ICD-10-CM | POA: Diagnosis not present

## 2022-06-15 LAB — URINE CULTURE: Culture: NO GROWTH

## 2022-06-23 ENCOUNTER — Other Ambulatory Visit (HOSPITAL_COMMUNITY): Payer: Self-pay | Admitting: Internal Medicine

## 2022-06-23 DIAGNOSIS — R921 Mammographic calcification found on diagnostic imaging of breast: Secondary | ICD-10-CM

## 2022-07-14 ENCOUNTER — Encounter (HOSPITAL_COMMUNITY): Payer: Self-pay

## 2022-07-14 ENCOUNTER — Ambulatory Visit (HOSPITAL_COMMUNITY)
Admission: RE | Admit: 2022-07-14 | Discharge: 2022-07-14 | Disposition: A | Discharge status: Home / Self Care | Payer: Medicare Other | Source: Ambulatory Visit | Attending: Internal Medicine | Admitting: Internal Medicine

## 2022-07-14 DIAGNOSIS — R921 Mammographic calcification found on diagnostic imaging of breast: Secondary | ICD-10-CM

## 2022-07-23 DIAGNOSIS — H43811 Vitreous degeneration, right eye: Secondary | ICD-10-CM | POA: Diagnosis not present

## 2022-07-23 DIAGNOSIS — E119 Type 2 diabetes mellitus without complications: Secondary | ICD-10-CM | POA: Diagnosis not present

## 2022-07-23 DIAGNOSIS — D3132 Benign neoplasm of left choroid: Secondary | ICD-10-CM | POA: Diagnosis not present

## 2022-07-23 DIAGNOSIS — H401134 Primary open-angle glaucoma, bilateral, indeterminate stage: Secondary | ICD-10-CM | POA: Diagnosis not present

## 2022-08-03 DIAGNOSIS — N1831 Chronic kidney disease, stage 3a: Secondary | ICD-10-CM | POA: Diagnosis not present

## 2022-08-03 DIAGNOSIS — E785 Hyperlipidemia, unspecified: Secondary | ICD-10-CM | POA: Diagnosis not present

## 2022-08-03 DIAGNOSIS — E1129 Type 2 diabetes mellitus with other diabetic kidney complication: Secondary | ICD-10-CM | POA: Diagnosis not present

## 2022-08-03 DIAGNOSIS — R001 Bradycardia, unspecified: Secondary | ICD-10-CM | POA: Diagnosis not present

## 2022-08-03 DIAGNOSIS — G43909 Migraine, unspecified, not intractable, without status migrainosus: Secondary | ICD-10-CM | POA: Diagnosis not present

## 2022-08-03 DIAGNOSIS — I10 Essential (primary) hypertension: Secondary | ICD-10-CM | POA: Diagnosis not present

## 2022-08-03 DIAGNOSIS — R609 Edema, unspecified: Secondary | ICD-10-CM | POA: Diagnosis not present

## 2022-08-03 DIAGNOSIS — Z79899 Other long term (current) drug therapy: Secondary | ICD-10-CM | POA: Diagnosis not present

## 2022-08-05 DIAGNOSIS — N3020 Other chronic cystitis without hematuria: Secondary | ICD-10-CM | POA: Diagnosis not present

## 2022-08-05 DIAGNOSIS — N3946 Mixed incontinence: Secondary | ICD-10-CM | POA: Diagnosis not present

## 2022-08-05 DIAGNOSIS — N952 Postmenopausal atrophic vaginitis: Secondary | ICD-10-CM | POA: Diagnosis not present

## 2022-08-10 DIAGNOSIS — E785 Hyperlipidemia, unspecified: Secondary | ICD-10-CM | POA: Diagnosis not present

## 2022-08-10 DIAGNOSIS — Z87440 Personal history of urinary (tract) infections: Secondary | ICD-10-CM | POA: Diagnosis not present

## 2022-08-10 DIAGNOSIS — R7309 Other abnormal glucose: Secondary | ICD-10-CM | POA: Diagnosis not present

## 2022-08-10 DIAGNOSIS — E1122 Type 2 diabetes mellitus with diabetic chronic kidney disease: Secondary | ICD-10-CM | POA: Diagnosis not present

## 2022-08-10 DIAGNOSIS — I10 Essential (primary) hypertension: Secondary | ICD-10-CM | POA: Diagnosis not present

## 2022-09-01 DIAGNOSIS — M545 Low back pain, unspecified: Secondary | ICD-10-CM | POA: Diagnosis not present

## 2022-09-02 DIAGNOSIS — M48061 Spinal stenosis, lumbar region without neurogenic claudication: Secondary | ICD-10-CM | POA: Diagnosis not present

## 2022-09-02 DIAGNOSIS — M545 Low back pain, unspecified: Secondary | ICD-10-CM | POA: Diagnosis not present

## 2022-09-09 DIAGNOSIS — M5416 Radiculopathy, lumbar region: Secondary | ICD-10-CM | POA: Diagnosis not present

## 2022-09-18 DIAGNOSIS — M961 Postlaminectomy syndrome, not elsewhere classified: Secondary | ICD-10-CM | POA: Diagnosis not present

## 2022-09-18 DIAGNOSIS — M5416 Radiculopathy, lumbar region: Secondary | ICD-10-CM | POA: Diagnosis not present

## 2022-10-07 DIAGNOSIS — M5416 Radiculopathy, lumbar region: Secondary | ICD-10-CM | POA: Diagnosis not present

## 2022-10-16 DIAGNOSIS — N3946 Mixed incontinence: Secondary | ICD-10-CM | POA: Diagnosis not present

## 2022-10-16 DIAGNOSIS — N3020 Other chronic cystitis without hematuria: Secondary | ICD-10-CM | POA: Diagnosis not present

## 2022-10-19 DIAGNOSIS — M5416 Radiculopathy, lumbar region: Secondary | ICD-10-CM | POA: Diagnosis not present

## 2022-10-19 DIAGNOSIS — M961 Postlaminectomy syndrome, not elsewhere classified: Secondary | ICD-10-CM | POA: Diagnosis not present

## 2022-10-19 DIAGNOSIS — E119 Type 2 diabetes mellitus without complications: Secondary | ICD-10-CM | POA: Diagnosis not present

## 2022-11-03 DIAGNOSIS — E1129 Type 2 diabetes mellitus with other diabetic kidney complication: Secondary | ICD-10-CM | POA: Diagnosis not present

## 2022-11-03 DIAGNOSIS — N1831 Chronic kidney disease, stage 3a: Secondary | ICD-10-CM | POA: Diagnosis not present

## 2022-11-03 DIAGNOSIS — E785 Hyperlipidemia, unspecified: Secondary | ICD-10-CM | POA: Diagnosis not present

## 2022-11-03 DIAGNOSIS — Z79899 Other long term (current) drug therapy: Secondary | ICD-10-CM | POA: Diagnosis not present

## 2022-11-03 DIAGNOSIS — I10 Essential (primary) hypertension: Secondary | ICD-10-CM | POA: Diagnosis not present

## 2022-11-11 DIAGNOSIS — I10 Essential (primary) hypertension: Secondary | ICD-10-CM | POA: Diagnosis not present

## 2022-11-11 DIAGNOSIS — R7309 Other abnormal glucose: Secondary | ICD-10-CM | POA: Diagnosis not present

## 2022-11-11 DIAGNOSIS — N1831 Chronic kidney disease, stage 3a: Secondary | ICD-10-CM | POA: Diagnosis not present

## 2022-11-11 DIAGNOSIS — E1122 Type 2 diabetes mellitus with diabetic chronic kidney disease: Secondary | ICD-10-CM | POA: Diagnosis not present

## 2022-11-23 DIAGNOSIS — N3946 Mixed incontinence: Secondary | ICD-10-CM | POA: Diagnosis not present

## 2022-12-23 DIAGNOSIS — M47896 Other spondylosis, lumbar region: Secondary | ICD-10-CM | POA: Diagnosis not present

## 2022-12-23 DIAGNOSIS — M961 Postlaminectomy syndrome, not elsewhere classified: Secondary | ICD-10-CM | POA: Diagnosis not present

## 2023-01-11 DIAGNOSIS — M961 Postlaminectomy syndrome, not elsewhere classified: Secondary | ICD-10-CM | POA: Diagnosis not present

## 2023-01-11 DIAGNOSIS — E119 Type 2 diabetes mellitus without complications: Secondary | ICD-10-CM | POA: Diagnosis not present

## 2023-01-11 DIAGNOSIS — M5416 Radiculopathy, lumbar region: Secondary | ICD-10-CM | POA: Diagnosis not present

## 2023-01-21 DIAGNOSIS — H401131 Primary open-angle glaucoma, bilateral, mild stage: Secondary | ICD-10-CM | POA: Diagnosis not present

## 2023-01-21 DIAGNOSIS — H26491 Other secondary cataract, right eye: Secondary | ICD-10-CM | POA: Diagnosis not present

## 2023-01-25 DIAGNOSIS — M961 Postlaminectomy syndrome, not elsewhere classified: Secondary | ICD-10-CM | POA: Diagnosis not present

## 2023-01-25 DIAGNOSIS — E119 Type 2 diabetes mellitus without complications: Secondary | ICD-10-CM | POA: Diagnosis not present

## 2023-01-25 DIAGNOSIS — M5416 Radiculopathy, lumbar region: Secondary | ICD-10-CM | POA: Diagnosis not present

## 2023-01-25 DIAGNOSIS — M47817 Spondylosis without myelopathy or radiculopathy, lumbosacral region: Secondary | ICD-10-CM | POA: Diagnosis not present

## 2023-01-25 DIAGNOSIS — G894 Chronic pain syndrome: Secondary | ICD-10-CM | POA: Diagnosis not present

## 2023-01-25 DIAGNOSIS — M47896 Other spondylosis, lumbar region: Secondary | ICD-10-CM | POA: Diagnosis not present

## 2023-01-28 DIAGNOSIS — M25552 Pain in left hip: Secondary | ICD-10-CM | POA: Diagnosis not present

## 2023-01-28 DIAGNOSIS — M545 Low back pain, unspecified: Secondary | ICD-10-CM | POA: Diagnosis not present

## 2023-01-29 ENCOUNTER — Other Ambulatory Visit (HOSPITAL_COMMUNITY): Payer: Self-pay | Admitting: Physician Assistant

## 2023-01-29 ENCOUNTER — Encounter: Payer: Self-pay | Admitting: Internal Medicine

## 2023-01-29 DIAGNOSIS — M545 Low back pain, unspecified: Secondary | ICD-10-CM

## 2023-01-29 DIAGNOSIS — M25552 Pain in left hip: Secondary | ICD-10-CM

## 2023-01-31 ENCOUNTER — Ambulatory Visit (HOSPITAL_COMMUNITY)
Admission: RE | Admit: 2023-01-31 | Discharge: 2023-01-31 | Discharge status: Home / Self Care | Payer: Medicare Other | Source: Ambulatory Visit | Attending: Physician Assistant

## 2023-01-31 ENCOUNTER — Ambulatory Visit (HOSPITAL_COMMUNITY)
Admission: RE | Admit: 2023-01-31 | Discharge: 2023-01-31 | Disposition: A | Discharge status: Home / Self Care | Payer: Medicare Other | Source: Ambulatory Visit | Attending: Physician Assistant | Admitting: Physician Assistant

## 2023-01-31 DIAGNOSIS — M25552 Pain in left hip: Secondary | ICD-10-CM | POA: Diagnosis not present

## 2023-01-31 DIAGNOSIS — M545 Low back pain, unspecified: Secondary | ICD-10-CM | POA: Diagnosis not present

## 2023-01-31 DIAGNOSIS — M48061 Spinal stenosis, lumbar region without neurogenic claudication: Secondary | ICD-10-CM | POA: Diagnosis not present

## 2023-01-31 DIAGNOSIS — M5135 Other intervertebral disc degeneration, thoracolumbar region: Secondary | ICD-10-CM | POA: Diagnosis not present

## 2023-01-31 DIAGNOSIS — M5136 Other intervertebral disc degeneration, lumbar region: Secondary | ICD-10-CM | POA: Diagnosis not present

## 2023-01-31 DIAGNOSIS — M7602 Gluteal tendinitis, left hip: Secondary | ICD-10-CM | POA: Diagnosis not present

## 2023-01-31 DIAGNOSIS — M79652 Pain in left thigh: Secondary | ICD-10-CM | POA: Diagnosis not present

## 2023-01-31 DIAGNOSIS — M5137 Other intervertebral disc degeneration, lumbosacral region: Secondary | ICD-10-CM | POA: Diagnosis not present

## 2023-02-11 DIAGNOSIS — M5416 Radiculopathy, lumbar region: Secondary | ICD-10-CM | POA: Diagnosis not present

## 2023-02-18 DIAGNOSIS — M961 Postlaminectomy syndrome, not elsewhere classified: Secondary | ICD-10-CM | POA: Diagnosis not present

## 2023-02-18 DIAGNOSIS — M5416 Radiculopathy, lumbar region: Secondary | ICD-10-CM | POA: Diagnosis not present

## 2023-03-06 DIAGNOSIS — Z87440 Personal history of urinary (tract) infections: Secondary | ICD-10-CM | POA: Diagnosis not present

## 2023-03-06 DIAGNOSIS — M5416 Radiculopathy, lumbar region: Secondary | ICD-10-CM | POA: Diagnosis not present

## 2023-03-06 DIAGNOSIS — M1990 Unspecified osteoarthritis, unspecified site: Secondary | ICD-10-CM | POA: Diagnosis not present

## 2023-03-06 DIAGNOSIS — N189 Chronic kidney disease, unspecified: Secondary | ICD-10-CM | POA: Diagnosis not present

## 2023-03-06 DIAGNOSIS — Z6836 Body mass index (BMI) 36.0-36.9, adult: Secondary | ICD-10-CM | POA: Diagnosis not present

## 2023-03-06 DIAGNOSIS — Z9181 History of falling: Secondary | ICD-10-CM | POA: Diagnosis not present

## 2023-03-06 DIAGNOSIS — Z556 Problems related to health literacy: Secondary | ICD-10-CM | POA: Diagnosis not present

## 2023-03-06 DIAGNOSIS — M48061 Spinal stenosis, lumbar region without neurogenic claudication: Secondary | ICD-10-CM | POA: Diagnosis not present

## 2023-03-06 DIAGNOSIS — I129 Hypertensive chronic kidney disease with stage 1 through stage 4 chronic kidney disease, or unspecified chronic kidney disease: Secondary | ICD-10-CM | POA: Diagnosis not present

## 2023-03-11 DIAGNOSIS — N189 Chronic kidney disease, unspecified: Secondary | ICD-10-CM | POA: Diagnosis not present

## 2023-03-11 DIAGNOSIS — M5416 Radiculopathy, lumbar region: Secondary | ICD-10-CM | POA: Diagnosis not present

## 2023-03-11 DIAGNOSIS — M48061 Spinal stenosis, lumbar region without neurogenic claudication: Secondary | ICD-10-CM | POA: Diagnosis not present

## 2023-03-11 DIAGNOSIS — M1990 Unspecified osteoarthritis, unspecified site: Secondary | ICD-10-CM | POA: Diagnosis not present

## 2023-03-11 DIAGNOSIS — I129 Hypertensive chronic kidney disease with stage 1 through stage 4 chronic kidney disease, or unspecified chronic kidney disease: Secondary | ICD-10-CM | POA: Diagnosis not present

## 2023-03-17 DIAGNOSIS — N189 Chronic kidney disease, unspecified: Secondary | ICD-10-CM | POA: Diagnosis not present

## 2023-03-17 DIAGNOSIS — M5416 Radiculopathy, lumbar region: Secondary | ICD-10-CM | POA: Diagnosis not present

## 2023-03-17 DIAGNOSIS — M48061 Spinal stenosis, lumbar region without neurogenic claudication: Secondary | ICD-10-CM | POA: Diagnosis not present

## 2023-03-17 DIAGNOSIS — I129 Hypertensive chronic kidney disease with stage 1 through stage 4 chronic kidney disease, or unspecified chronic kidney disease: Secondary | ICD-10-CM | POA: Diagnosis not present

## 2023-03-17 DIAGNOSIS — M1990 Unspecified osteoarthritis, unspecified site: Secondary | ICD-10-CM | POA: Diagnosis not present

## 2023-03-24 DIAGNOSIS — M48061 Spinal stenosis, lumbar region without neurogenic claudication: Secondary | ICD-10-CM | POA: Diagnosis not present

## 2023-03-24 DIAGNOSIS — M5416 Radiculopathy, lumbar region: Secondary | ICD-10-CM | POA: Diagnosis not present

## 2023-03-24 DIAGNOSIS — I129 Hypertensive chronic kidney disease with stage 1 through stage 4 chronic kidney disease, or unspecified chronic kidney disease: Secondary | ICD-10-CM | POA: Diagnosis not present

## 2023-03-24 DIAGNOSIS — N189 Chronic kidney disease, unspecified: Secondary | ICD-10-CM | POA: Diagnosis not present

## 2023-03-24 DIAGNOSIS — M1990 Unspecified osteoarthritis, unspecified site: Secondary | ICD-10-CM | POA: Diagnosis not present

## 2023-03-31 DIAGNOSIS — N189 Chronic kidney disease, unspecified: Secondary | ICD-10-CM | POA: Diagnosis not present

## 2023-03-31 DIAGNOSIS — M5416 Radiculopathy, lumbar region: Secondary | ICD-10-CM | POA: Diagnosis not present

## 2023-03-31 DIAGNOSIS — M1990 Unspecified osteoarthritis, unspecified site: Secondary | ICD-10-CM | POA: Diagnosis not present

## 2023-03-31 DIAGNOSIS — M48061 Spinal stenosis, lumbar region without neurogenic claudication: Secondary | ICD-10-CM | POA: Diagnosis not present

## 2023-03-31 DIAGNOSIS — I129 Hypertensive chronic kidney disease with stage 1 through stage 4 chronic kidney disease, or unspecified chronic kidney disease: Secondary | ICD-10-CM | POA: Diagnosis not present

## 2023-04-05 DIAGNOSIS — N189 Chronic kidney disease, unspecified: Secondary | ICD-10-CM | POA: Diagnosis not present

## 2023-04-05 DIAGNOSIS — Z9181 History of falling: Secondary | ICD-10-CM | POA: Diagnosis not present

## 2023-04-05 DIAGNOSIS — Z556 Problems related to health literacy: Secondary | ICD-10-CM | POA: Diagnosis not present

## 2023-04-05 DIAGNOSIS — M48061 Spinal stenosis, lumbar region without neurogenic claudication: Secondary | ICD-10-CM | POA: Diagnosis not present

## 2023-04-05 DIAGNOSIS — Z87440 Personal history of urinary (tract) infections: Secondary | ICD-10-CM | POA: Diagnosis not present

## 2023-04-05 DIAGNOSIS — Z6836 Body mass index (BMI) 36.0-36.9, adult: Secondary | ICD-10-CM | POA: Diagnosis not present

## 2023-04-05 DIAGNOSIS — M5416 Radiculopathy, lumbar region: Secondary | ICD-10-CM | POA: Diagnosis not present

## 2023-04-05 DIAGNOSIS — M1990 Unspecified osteoarthritis, unspecified site: Secondary | ICD-10-CM | POA: Diagnosis not present

## 2023-04-05 DIAGNOSIS — I129 Hypertensive chronic kidney disease with stage 1 through stage 4 chronic kidney disease, or unspecified chronic kidney disease: Secondary | ICD-10-CM | POA: Diagnosis not present

## 2023-04-06 DIAGNOSIS — M48061 Spinal stenosis, lumbar region without neurogenic claudication: Secondary | ICD-10-CM | POA: Diagnosis not present

## 2023-04-06 DIAGNOSIS — M1990 Unspecified osteoarthritis, unspecified site: Secondary | ICD-10-CM | POA: Diagnosis not present

## 2023-04-06 DIAGNOSIS — N189 Chronic kidney disease, unspecified: Secondary | ICD-10-CM | POA: Diagnosis not present

## 2023-04-06 DIAGNOSIS — M5416 Radiculopathy, lumbar region: Secondary | ICD-10-CM | POA: Diagnosis not present

## 2023-04-06 DIAGNOSIS — I129 Hypertensive chronic kidney disease with stage 1 through stage 4 chronic kidney disease, or unspecified chronic kidney disease: Secondary | ICD-10-CM | POA: Diagnosis not present

## 2023-04-13 DIAGNOSIS — M1990 Unspecified osteoarthritis, unspecified site: Secondary | ICD-10-CM | POA: Diagnosis not present

## 2023-04-13 DIAGNOSIS — M5416 Radiculopathy, lumbar region: Secondary | ICD-10-CM | POA: Diagnosis not present

## 2023-04-13 DIAGNOSIS — N189 Chronic kidney disease, unspecified: Secondary | ICD-10-CM | POA: Diagnosis not present

## 2023-04-13 DIAGNOSIS — I129 Hypertensive chronic kidney disease with stage 1 through stage 4 chronic kidney disease, or unspecified chronic kidney disease: Secondary | ICD-10-CM | POA: Diagnosis not present

## 2023-04-13 DIAGNOSIS — M48061 Spinal stenosis, lumbar region without neurogenic claudication: Secondary | ICD-10-CM | POA: Diagnosis not present

## 2023-04-23 DIAGNOSIS — M1990 Unspecified osteoarthritis, unspecified site: Secondary | ICD-10-CM | POA: Diagnosis not present

## 2023-04-23 DIAGNOSIS — M5416 Radiculopathy, lumbar region: Secondary | ICD-10-CM | POA: Diagnosis not present

## 2023-04-23 DIAGNOSIS — I129 Hypertensive chronic kidney disease with stage 1 through stage 4 chronic kidney disease, or unspecified chronic kidney disease: Secondary | ICD-10-CM | POA: Diagnosis not present

## 2023-04-23 DIAGNOSIS — M48061 Spinal stenosis, lumbar region without neurogenic claudication: Secondary | ICD-10-CM | POA: Diagnosis not present

## 2023-04-23 DIAGNOSIS — N189 Chronic kidney disease, unspecified: Secondary | ICD-10-CM | POA: Diagnosis not present

## 2023-04-30 DIAGNOSIS — N189 Chronic kidney disease, unspecified: Secondary | ICD-10-CM | POA: Diagnosis not present

## 2023-04-30 DIAGNOSIS — M48061 Spinal stenosis, lumbar region without neurogenic claudication: Secondary | ICD-10-CM | POA: Diagnosis not present

## 2023-04-30 DIAGNOSIS — M1990 Unspecified osteoarthritis, unspecified site: Secondary | ICD-10-CM | POA: Diagnosis not present

## 2023-04-30 DIAGNOSIS — M5416 Radiculopathy, lumbar region: Secondary | ICD-10-CM | POA: Diagnosis not present

## 2023-04-30 DIAGNOSIS — I129 Hypertensive chronic kidney disease with stage 1 through stage 4 chronic kidney disease, or unspecified chronic kidney disease: Secondary | ICD-10-CM | POA: Diagnosis not present

## 2023-05-05 DIAGNOSIS — I129 Hypertensive chronic kidney disease with stage 1 through stage 4 chronic kidney disease, or unspecified chronic kidney disease: Secondary | ICD-10-CM | POA: Diagnosis not present

## 2023-05-05 DIAGNOSIS — Z6836 Body mass index (BMI) 36.0-36.9, adult: Secondary | ICD-10-CM | POA: Diagnosis not present

## 2023-05-05 DIAGNOSIS — Z87440 Personal history of urinary (tract) infections: Secondary | ICD-10-CM | POA: Diagnosis not present

## 2023-05-05 DIAGNOSIS — M1990 Unspecified osteoarthritis, unspecified site: Secondary | ICD-10-CM | POA: Diagnosis not present

## 2023-05-05 DIAGNOSIS — M48061 Spinal stenosis, lumbar region without neurogenic claudication: Secondary | ICD-10-CM | POA: Diagnosis not present

## 2023-05-05 DIAGNOSIS — N189 Chronic kidney disease, unspecified: Secondary | ICD-10-CM | POA: Diagnosis not present

## 2023-05-05 DIAGNOSIS — Z556 Problems related to health literacy: Secondary | ICD-10-CM | POA: Diagnosis not present

## 2023-05-05 DIAGNOSIS — Z9181 History of falling: Secondary | ICD-10-CM | POA: Diagnosis not present

## 2023-05-05 DIAGNOSIS — M5416 Radiculopathy, lumbar region: Secondary | ICD-10-CM | POA: Diagnosis not present

## 2023-05-12 DIAGNOSIS — N189 Chronic kidney disease, unspecified: Secondary | ICD-10-CM | POA: Diagnosis not present

## 2023-05-12 DIAGNOSIS — I129 Hypertensive chronic kidney disease with stage 1 through stage 4 chronic kidney disease, or unspecified chronic kidney disease: Secondary | ICD-10-CM | POA: Diagnosis not present

## 2023-05-12 DIAGNOSIS — M48061 Spinal stenosis, lumbar region without neurogenic claudication: Secondary | ICD-10-CM | POA: Diagnosis not present

## 2023-05-12 DIAGNOSIS — M1990 Unspecified osteoarthritis, unspecified site: Secondary | ICD-10-CM | POA: Diagnosis not present

## 2023-05-12 DIAGNOSIS — M5416 Radiculopathy, lumbar region: Secondary | ICD-10-CM | POA: Diagnosis not present

## 2023-05-18 DIAGNOSIS — M1990 Unspecified osteoarthritis, unspecified site: Secondary | ICD-10-CM | POA: Diagnosis not present

## 2023-05-18 DIAGNOSIS — M48061 Spinal stenosis, lumbar region without neurogenic claudication: Secondary | ICD-10-CM | POA: Diagnosis not present

## 2023-05-18 DIAGNOSIS — N189 Chronic kidney disease, unspecified: Secondary | ICD-10-CM | POA: Diagnosis not present

## 2023-05-18 DIAGNOSIS — I129 Hypertensive chronic kidney disease with stage 1 through stage 4 chronic kidney disease, or unspecified chronic kidney disease: Secondary | ICD-10-CM | POA: Diagnosis not present

## 2023-05-18 DIAGNOSIS — M5416 Radiculopathy, lumbar region: Secondary | ICD-10-CM | POA: Diagnosis not present

## 2023-05-24 DIAGNOSIS — I129 Hypertensive chronic kidney disease with stage 1 through stage 4 chronic kidney disease, or unspecified chronic kidney disease: Secondary | ICD-10-CM | POA: Diagnosis not present

## 2023-05-24 DIAGNOSIS — M5416 Radiculopathy, lumbar region: Secondary | ICD-10-CM | POA: Diagnosis not present

## 2023-05-24 DIAGNOSIS — N189 Chronic kidney disease, unspecified: Secondary | ICD-10-CM | POA: Diagnosis not present

## 2023-05-24 DIAGNOSIS — M48061 Spinal stenosis, lumbar region without neurogenic claudication: Secondary | ICD-10-CM | POA: Diagnosis not present

## 2023-05-24 DIAGNOSIS — M1990 Unspecified osteoarthritis, unspecified site: Secondary | ICD-10-CM | POA: Diagnosis not present

## 2023-06-02 DIAGNOSIS — M5416 Radiculopathy, lumbar region: Secondary | ICD-10-CM | POA: Diagnosis not present

## 2023-06-02 DIAGNOSIS — M48061 Spinal stenosis, lumbar region without neurogenic claudication: Secondary | ICD-10-CM | POA: Diagnosis not present

## 2023-06-02 DIAGNOSIS — M1990 Unspecified osteoarthritis, unspecified site: Secondary | ICD-10-CM | POA: Diagnosis not present

## 2023-06-02 DIAGNOSIS — N3020 Other chronic cystitis without hematuria: Secondary | ICD-10-CM | POA: Diagnosis not present

## 2023-06-02 DIAGNOSIS — I129 Hypertensive chronic kidney disease with stage 1 through stage 4 chronic kidney disease, or unspecified chronic kidney disease: Secondary | ICD-10-CM | POA: Diagnosis not present

## 2023-06-02 DIAGNOSIS — N189 Chronic kidney disease, unspecified: Secondary | ICD-10-CM | POA: Diagnosis not present

## 2023-06-02 DIAGNOSIS — R8271 Bacteriuria: Secondary | ICD-10-CM | POA: Diagnosis not present

## 2023-06-02 DIAGNOSIS — N952 Postmenopausal atrophic vaginitis: Secondary | ICD-10-CM | POA: Diagnosis not present

## 2023-06-02 DIAGNOSIS — N3946 Mixed incontinence: Secondary | ICD-10-CM | POA: Diagnosis not present

## 2023-06-04 DIAGNOSIS — Z6836 Body mass index (BMI) 36.0-36.9, adult: Secondary | ICD-10-CM | POA: Diagnosis not present

## 2023-06-04 DIAGNOSIS — Z556 Problems related to health literacy: Secondary | ICD-10-CM | POA: Diagnosis not present

## 2023-06-04 DIAGNOSIS — Z9181 History of falling: Secondary | ICD-10-CM | POA: Diagnosis not present

## 2023-06-04 DIAGNOSIS — M48061 Spinal stenosis, lumbar region without neurogenic claudication: Secondary | ICD-10-CM | POA: Diagnosis not present

## 2023-06-04 DIAGNOSIS — M5416 Radiculopathy, lumbar region: Secondary | ICD-10-CM | POA: Diagnosis not present

## 2023-06-04 DIAGNOSIS — I129 Hypertensive chronic kidney disease with stage 1 through stage 4 chronic kidney disease, or unspecified chronic kidney disease: Secondary | ICD-10-CM | POA: Diagnosis not present

## 2023-06-04 DIAGNOSIS — M1990 Unspecified osteoarthritis, unspecified site: Secondary | ICD-10-CM | POA: Diagnosis not present

## 2023-06-04 DIAGNOSIS — Z87440 Personal history of urinary (tract) infections: Secondary | ICD-10-CM | POA: Diagnosis not present

## 2023-06-04 DIAGNOSIS — N189 Chronic kidney disease, unspecified: Secondary | ICD-10-CM | POA: Diagnosis not present

## 2023-06-09 DIAGNOSIS — M5416 Radiculopathy, lumbar region: Secondary | ICD-10-CM | POA: Diagnosis not present

## 2023-06-09 DIAGNOSIS — N189 Chronic kidney disease, unspecified: Secondary | ICD-10-CM | POA: Diagnosis not present

## 2023-06-09 DIAGNOSIS — M48061 Spinal stenosis, lumbar region without neurogenic claudication: Secondary | ICD-10-CM | POA: Diagnosis not present

## 2023-06-09 DIAGNOSIS — I129 Hypertensive chronic kidney disease with stage 1 through stage 4 chronic kidney disease, or unspecified chronic kidney disease: Secondary | ICD-10-CM | POA: Diagnosis not present

## 2023-06-09 DIAGNOSIS — M1990 Unspecified osteoarthritis, unspecified site: Secondary | ICD-10-CM | POA: Diagnosis not present

## 2023-06-14 DIAGNOSIS — M48061 Spinal stenosis, lumbar region without neurogenic claudication: Secondary | ICD-10-CM | POA: Diagnosis not present

## 2023-06-14 DIAGNOSIS — M5416 Radiculopathy, lumbar region: Secondary | ICD-10-CM | POA: Diagnosis not present

## 2023-06-14 DIAGNOSIS — I129 Hypertensive chronic kidney disease with stage 1 through stage 4 chronic kidney disease, or unspecified chronic kidney disease: Secondary | ICD-10-CM | POA: Diagnosis not present

## 2023-06-14 DIAGNOSIS — N189 Chronic kidney disease, unspecified: Secondary | ICD-10-CM | POA: Diagnosis not present

## 2023-06-14 DIAGNOSIS — M1990 Unspecified osteoarthritis, unspecified site: Secondary | ICD-10-CM | POA: Diagnosis not present

## 2023-06-15 DIAGNOSIS — N3946 Mixed incontinence: Secondary | ICD-10-CM | POA: Diagnosis not present

## 2023-06-15 DIAGNOSIS — N3020 Other chronic cystitis without hematuria: Secondary | ICD-10-CM | POA: Diagnosis not present

## 2023-06-21 DIAGNOSIS — M1990 Unspecified osteoarthritis, unspecified site: Secondary | ICD-10-CM | POA: Diagnosis not present

## 2023-06-21 DIAGNOSIS — N189 Chronic kidney disease, unspecified: Secondary | ICD-10-CM | POA: Diagnosis not present

## 2023-06-21 DIAGNOSIS — M5416 Radiculopathy, lumbar region: Secondary | ICD-10-CM | POA: Diagnosis not present

## 2023-06-21 DIAGNOSIS — I129 Hypertensive chronic kidney disease with stage 1 through stage 4 chronic kidney disease, or unspecified chronic kidney disease: Secondary | ICD-10-CM | POA: Diagnosis not present

## 2023-06-21 DIAGNOSIS — M48061 Spinal stenosis, lumbar region without neurogenic claudication: Secondary | ICD-10-CM | POA: Diagnosis not present

## 2023-06-30 DIAGNOSIS — M5416 Radiculopathy, lumbar region: Secondary | ICD-10-CM | POA: Diagnosis not present

## 2023-06-30 DIAGNOSIS — M1990 Unspecified osteoarthritis, unspecified site: Secondary | ICD-10-CM | POA: Diagnosis not present

## 2023-06-30 DIAGNOSIS — N189 Chronic kidney disease, unspecified: Secondary | ICD-10-CM | POA: Diagnosis not present

## 2023-06-30 DIAGNOSIS — I129 Hypertensive chronic kidney disease with stage 1 through stage 4 chronic kidney disease, or unspecified chronic kidney disease: Secondary | ICD-10-CM | POA: Diagnosis not present

## 2023-06-30 DIAGNOSIS — M48061 Spinal stenosis, lumbar region without neurogenic claudication: Secondary | ICD-10-CM | POA: Diagnosis not present

## 2023-07-04 DIAGNOSIS — E785 Hyperlipidemia, unspecified: Secondary | ICD-10-CM | POA: Diagnosis not present

## 2023-07-04 DIAGNOSIS — I129 Hypertensive chronic kidney disease with stage 1 through stage 4 chronic kidney disease, or unspecified chronic kidney disease: Secondary | ICD-10-CM | POA: Diagnosis not present

## 2023-07-04 DIAGNOSIS — M5416 Radiculopathy, lumbar region: Secondary | ICD-10-CM | POA: Diagnosis not present

## 2023-07-04 DIAGNOSIS — Z9181 History of falling: Secondary | ICD-10-CM | POA: Diagnosis not present

## 2023-07-04 DIAGNOSIS — M48061 Spinal stenosis, lumbar region without neurogenic claudication: Secondary | ICD-10-CM | POA: Diagnosis not present

## 2023-07-04 DIAGNOSIS — M1990 Unspecified osteoarthritis, unspecified site: Secondary | ICD-10-CM | POA: Diagnosis not present

## 2023-07-04 DIAGNOSIS — N189 Chronic kidney disease, unspecified: Secondary | ICD-10-CM | POA: Diagnosis not present

## 2023-07-04 DIAGNOSIS — Z87440 Personal history of urinary (tract) infections: Secondary | ICD-10-CM | POA: Diagnosis not present

## 2023-07-04 DIAGNOSIS — Z556 Problems related to health literacy: Secondary | ICD-10-CM | POA: Diagnosis not present

## 2023-07-04 DIAGNOSIS — Z6836 Body mass index (BMI) 36.0-36.9, adult: Secondary | ICD-10-CM | POA: Diagnosis not present

## 2023-07-05 DIAGNOSIS — N3020 Other chronic cystitis without hematuria: Secondary | ICD-10-CM | POA: Diagnosis not present

## 2023-07-05 DIAGNOSIS — N3946 Mixed incontinence: Secondary | ICD-10-CM | POA: Diagnosis not present

## 2023-07-08 DIAGNOSIS — E785 Hyperlipidemia, unspecified: Secondary | ICD-10-CM | POA: Diagnosis not present

## 2023-07-08 DIAGNOSIS — M5416 Radiculopathy, lumbar region: Secondary | ICD-10-CM | POA: Diagnosis not present

## 2023-07-08 DIAGNOSIS — N189 Chronic kidney disease, unspecified: Secondary | ICD-10-CM | POA: Diagnosis not present

## 2023-07-08 DIAGNOSIS — M1990 Unspecified osteoarthritis, unspecified site: Secondary | ICD-10-CM | POA: Diagnosis not present

## 2023-07-08 DIAGNOSIS — I129 Hypertensive chronic kidney disease with stage 1 through stage 4 chronic kidney disease, or unspecified chronic kidney disease: Secondary | ICD-10-CM | POA: Diagnosis not present

## 2023-07-08 DIAGNOSIS — M48061 Spinal stenosis, lumbar region without neurogenic claudication: Secondary | ICD-10-CM | POA: Diagnosis not present

## 2023-07-14 DIAGNOSIS — N189 Chronic kidney disease, unspecified: Secondary | ICD-10-CM | POA: Diagnosis not present

## 2023-07-14 DIAGNOSIS — M5416 Radiculopathy, lumbar region: Secondary | ICD-10-CM | POA: Diagnosis not present

## 2023-07-14 DIAGNOSIS — M1990 Unspecified osteoarthritis, unspecified site: Secondary | ICD-10-CM | POA: Diagnosis not present

## 2023-07-14 DIAGNOSIS — I129 Hypertensive chronic kidney disease with stage 1 through stage 4 chronic kidney disease, or unspecified chronic kidney disease: Secondary | ICD-10-CM | POA: Diagnosis not present

## 2023-07-14 DIAGNOSIS — E785 Hyperlipidemia, unspecified: Secondary | ICD-10-CM | POA: Diagnosis not present

## 2023-07-14 DIAGNOSIS — M48061 Spinal stenosis, lumbar region without neurogenic claudication: Secondary | ICD-10-CM | POA: Diagnosis not present

## 2023-07-21 DIAGNOSIS — M1990 Unspecified osteoarthritis, unspecified site: Secondary | ICD-10-CM | POA: Diagnosis not present

## 2023-07-21 DIAGNOSIS — I129 Hypertensive chronic kidney disease with stage 1 through stage 4 chronic kidney disease, or unspecified chronic kidney disease: Secondary | ICD-10-CM | POA: Diagnosis not present

## 2023-07-21 DIAGNOSIS — N189 Chronic kidney disease, unspecified: Secondary | ICD-10-CM | POA: Diagnosis not present

## 2023-07-21 DIAGNOSIS — E785 Hyperlipidemia, unspecified: Secondary | ICD-10-CM | POA: Diagnosis not present

## 2023-07-21 DIAGNOSIS — M5416 Radiculopathy, lumbar region: Secondary | ICD-10-CM | POA: Diagnosis not present

## 2023-07-21 DIAGNOSIS — M48061 Spinal stenosis, lumbar region without neurogenic claudication: Secondary | ICD-10-CM | POA: Diagnosis not present

## 2023-07-27 DIAGNOSIS — N189 Chronic kidney disease, unspecified: Secondary | ICD-10-CM | POA: Diagnosis not present

## 2023-07-27 DIAGNOSIS — M5416 Radiculopathy, lumbar region: Secondary | ICD-10-CM | POA: Diagnosis not present

## 2023-07-27 DIAGNOSIS — I129 Hypertensive chronic kidney disease with stage 1 through stage 4 chronic kidney disease, or unspecified chronic kidney disease: Secondary | ICD-10-CM | POA: Diagnosis not present

## 2023-07-27 DIAGNOSIS — M1990 Unspecified osteoarthritis, unspecified site: Secondary | ICD-10-CM | POA: Diagnosis not present

## 2023-07-27 DIAGNOSIS — M48061 Spinal stenosis, lumbar region without neurogenic claudication: Secondary | ICD-10-CM | POA: Diagnosis not present

## 2023-07-27 DIAGNOSIS — E785 Hyperlipidemia, unspecified: Secondary | ICD-10-CM | POA: Diagnosis not present

## 2023-08-03 DIAGNOSIS — E785 Hyperlipidemia, unspecified: Secondary | ICD-10-CM | POA: Diagnosis not present

## 2023-08-03 DIAGNOSIS — Z6836 Body mass index (BMI) 36.0-36.9, adult: Secondary | ICD-10-CM | POA: Diagnosis not present

## 2023-08-03 DIAGNOSIS — Z556 Problems related to health literacy: Secondary | ICD-10-CM | POA: Diagnosis not present

## 2023-08-03 DIAGNOSIS — M48061 Spinal stenosis, lumbar region without neurogenic claudication: Secondary | ICD-10-CM | POA: Diagnosis not present

## 2023-08-03 DIAGNOSIS — I129 Hypertensive chronic kidney disease with stage 1 through stage 4 chronic kidney disease, or unspecified chronic kidney disease: Secondary | ICD-10-CM | POA: Diagnosis not present

## 2023-08-03 DIAGNOSIS — Z87440 Personal history of urinary (tract) infections: Secondary | ICD-10-CM | POA: Diagnosis not present

## 2023-08-03 DIAGNOSIS — M1990 Unspecified osteoarthritis, unspecified site: Secondary | ICD-10-CM | POA: Diagnosis not present

## 2023-08-03 DIAGNOSIS — N189 Chronic kidney disease, unspecified: Secondary | ICD-10-CM | POA: Diagnosis not present

## 2023-08-03 DIAGNOSIS — M5416 Radiculopathy, lumbar region: Secondary | ICD-10-CM | POA: Diagnosis not present

## 2023-08-03 DIAGNOSIS — Z9181 History of falling: Secondary | ICD-10-CM | POA: Diagnosis not present

## 2023-08-05 DIAGNOSIS — I10 Essential (primary) hypertension: Secondary | ICD-10-CM | POA: Diagnosis not present

## 2023-08-05 DIAGNOSIS — E1129 Type 2 diabetes mellitus with other diabetic kidney complication: Secondary | ICD-10-CM | POA: Diagnosis not present

## 2023-08-05 DIAGNOSIS — E785 Hyperlipidemia, unspecified: Secondary | ICD-10-CM | POA: Diagnosis not present

## 2023-08-05 DIAGNOSIS — G43909 Migraine, unspecified, not intractable, without status migrainosus: Secondary | ICD-10-CM | POA: Diagnosis not present

## 2023-08-05 DIAGNOSIS — M1990 Unspecified osteoarthritis, unspecified site: Secondary | ICD-10-CM | POA: Diagnosis not present

## 2023-08-05 DIAGNOSIS — R609 Edema, unspecified: Secondary | ICD-10-CM | POA: Diagnosis not present

## 2023-08-05 DIAGNOSIS — Z79899 Other long term (current) drug therapy: Secondary | ICD-10-CM | POA: Diagnosis not present

## 2023-08-05 DIAGNOSIS — N1831 Chronic kidney disease, stage 3a: Secondary | ICD-10-CM | POA: Diagnosis not present

## 2023-08-12 DIAGNOSIS — I10 Essential (primary) hypertension: Secondary | ICD-10-CM | POA: Diagnosis not present

## 2023-08-12 DIAGNOSIS — N1831 Chronic kidney disease, stage 3a: Secondary | ICD-10-CM | POA: Diagnosis not present

## 2023-08-12 DIAGNOSIS — Z0001 Encounter for general adult medical examination with abnormal findings: Secondary | ICD-10-CM | POA: Diagnosis not present

## 2023-08-12 DIAGNOSIS — E785 Hyperlipidemia, unspecified: Secondary | ICD-10-CM | POA: Diagnosis not present

## 2023-08-12 DIAGNOSIS — N390 Urinary tract infection, site not specified: Secondary | ICD-10-CM | POA: Diagnosis not present

## 2023-08-12 DIAGNOSIS — I499 Cardiac arrhythmia, unspecified: Secondary | ICD-10-CM | POA: Diagnosis not present

## 2023-08-12 DIAGNOSIS — E1129 Type 2 diabetes mellitus with other diabetic kidney complication: Secondary | ICD-10-CM | POA: Diagnosis not present

## 2023-08-13 DIAGNOSIS — M1990 Unspecified osteoarthritis, unspecified site: Secondary | ICD-10-CM | POA: Diagnosis not present

## 2023-08-13 DIAGNOSIS — M48061 Spinal stenosis, lumbar region without neurogenic claudication: Secondary | ICD-10-CM | POA: Diagnosis not present

## 2023-08-13 DIAGNOSIS — I129 Hypertensive chronic kidney disease with stage 1 through stage 4 chronic kidney disease, or unspecified chronic kidney disease: Secondary | ICD-10-CM | POA: Diagnosis not present

## 2023-08-13 DIAGNOSIS — N189 Chronic kidney disease, unspecified: Secondary | ICD-10-CM | POA: Diagnosis not present

## 2023-08-13 DIAGNOSIS — M5416 Radiculopathy, lumbar region: Secondary | ICD-10-CM | POA: Diagnosis not present

## 2023-08-13 DIAGNOSIS — E785 Hyperlipidemia, unspecified: Secondary | ICD-10-CM | POA: Diagnosis not present

## 2023-08-17 DIAGNOSIS — N3946 Mixed incontinence: Secondary | ICD-10-CM | POA: Diagnosis not present

## 2023-08-18 DIAGNOSIS — M1990 Unspecified osteoarthritis, unspecified site: Secondary | ICD-10-CM | POA: Diagnosis not present

## 2023-08-18 DIAGNOSIS — M5416 Radiculopathy, lumbar region: Secondary | ICD-10-CM | POA: Diagnosis not present

## 2023-08-18 DIAGNOSIS — I129 Hypertensive chronic kidney disease with stage 1 through stage 4 chronic kidney disease, or unspecified chronic kidney disease: Secondary | ICD-10-CM | POA: Diagnosis not present

## 2023-08-18 DIAGNOSIS — E785 Hyperlipidemia, unspecified: Secondary | ICD-10-CM | POA: Diagnosis not present

## 2023-08-18 DIAGNOSIS — M48061 Spinal stenosis, lumbar region without neurogenic claudication: Secondary | ICD-10-CM | POA: Diagnosis not present

## 2023-08-18 DIAGNOSIS — N189 Chronic kidney disease, unspecified: Secondary | ICD-10-CM | POA: Diagnosis not present

## 2023-08-25 DIAGNOSIS — N3941 Urge incontinence: Secondary | ICD-10-CM | POA: Diagnosis not present

## 2023-08-25 DIAGNOSIS — N952 Postmenopausal atrophic vaginitis: Secondary | ICD-10-CM | POA: Diagnosis not present

## 2023-08-25 DIAGNOSIS — N3020 Other chronic cystitis without hematuria: Secondary | ICD-10-CM | POA: Diagnosis not present

## 2023-08-25 DIAGNOSIS — R350 Frequency of micturition: Secondary | ICD-10-CM | POA: Diagnosis not present

## 2023-08-30 DIAGNOSIS — E785 Hyperlipidemia, unspecified: Secondary | ICD-10-CM | POA: Diagnosis not present

## 2023-08-30 DIAGNOSIS — M48061 Spinal stenosis, lumbar region without neurogenic claudication: Secondary | ICD-10-CM | POA: Diagnosis not present

## 2023-08-30 DIAGNOSIS — I129 Hypertensive chronic kidney disease with stage 1 through stage 4 chronic kidney disease, or unspecified chronic kidney disease: Secondary | ICD-10-CM | POA: Diagnosis not present

## 2023-08-30 DIAGNOSIS — M5416 Radiculopathy, lumbar region: Secondary | ICD-10-CM | POA: Diagnosis not present

## 2023-08-30 DIAGNOSIS — M1990 Unspecified osteoarthritis, unspecified site: Secondary | ICD-10-CM | POA: Diagnosis not present

## 2023-08-30 DIAGNOSIS — N189 Chronic kidney disease, unspecified: Secondary | ICD-10-CM | POA: Diagnosis not present

## 2023-09-02 DIAGNOSIS — M48061 Spinal stenosis, lumbar region without neurogenic claudication: Secondary | ICD-10-CM | POA: Diagnosis not present

## 2023-09-02 DIAGNOSIS — I129 Hypertensive chronic kidney disease with stage 1 through stage 4 chronic kidney disease, or unspecified chronic kidney disease: Secondary | ICD-10-CM | POA: Diagnosis not present

## 2023-09-02 DIAGNOSIS — M4726 Other spondylosis with radiculopathy, lumbar region: Secondary | ICD-10-CM | POA: Diagnosis not present

## 2023-09-02 DIAGNOSIS — Z87440 Personal history of urinary (tract) infections: Secondary | ICD-10-CM | POA: Diagnosis not present

## 2023-09-02 DIAGNOSIS — Z6836 Body mass index (BMI) 36.0-36.9, adult: Secondary | ICD-10-CM | POA: Diagnosis not present

## 2023-09-02 DIAGNOSIS — Z9181 History of falling: Secondary | ICD-10-CM | POA: Diagnosis not present

## 2023-09-02 DIAGNOSIS — Z556 Problems related to health literacy: Secondary | ICD-10-CM | POA: Diagnosis not present

## 2023-09-02 DIAGNOSIS — N189 Chronic kidney disease, unspecified: Secondary | ICD-10-CM | POA: Diagnosis not present

## 2023-09-02 DIAGNOSIS — M1990 Unspecified osteoarthritis, unspecified site: Secondary | ICD-10-CM | POA: Diagnosis not present

## 2023-09-02 DIAGNOSIS — E7800 Pure hypercholesterolemia, unspecified: Secondary | ICD-10-CM | POA: Diagnosis not present

## 2023-09-06 DIAGNOSIS — R350 Frequency of micturition: Secondary | ICD-10-CM | POA: Diagnosis not present

## 2023-09-06 DIAGNOSIS — N3941 Urge incontinence: Secondary | ICD-10-CM | POA: Diagnosis not present

## 2023-09-10 DIAGNOSIS — I129 Hypertensive chronic kidney disease with stage 1 through stage 4 chronic kidney disease, or unspecified chronic kidney disease: Secondary | ICD-10-CM | POA: Diagnosis not present

## 2023-09-10 DIAGNOSIS — M4726 Other spondylosis with radiculopathy, lumbar region: Secondary | ICD-10-CM | POA: Diagnosis not present

## 2023-09-10 DIAGNOSIS — N189 Chronic kidney disease, unspecified: Secondary | ICD-10-CM | POA: Diagnosis not present

## 2023-09-10 DIAGNOSIS — M1990 Unspecified osteoarthritis, unspecified site: Secondary | ICD-10-CM | POA: Diagnosis not present

## 2023-09-10 DIAGNOSIS — M48061 Spinal stenosis, lumbar region without neurogenic claudication: Secondary | ICD-10-CM | POA: Diagnosis not present

## 2023-09-15 DIAGNOSIS — N189 Chronic kidney disease, unspecified: Secondary | ICD-10-CM | POA: Diagnosis not present

## 2023-09-15 DIAGNOSIS — I129 Hypertensive chronic kidney disease with stage 1 through stage 4 chronic kidney disease, or unspecified chronic kidney disease: Secondary | ICD-10-CM | POA: Diagnosis not present

## 2023-09-15 DIAGNOSIS — M48061 Spinal stenosis, lumbar region without neurogenic claudication: Secondary | ICD-10-CM | POA: Diagnosis not present

## 2023-09-15 DIAGNOSIS — M1990 Unspecified osteoarthritis, unspecified site: Secondary | ICD-10-CM | POA: Diagnosis not present

## 2023-09-15 DIAGNOSIS — M4726 Other spondylosis with radiculopathy, lumbar region: Secondary | ICD-10-CM | POA: Diagnosis not present

## 2023-09-20 DIAGNOSIS — N3020 Other chronic cystitis without hematuria: Secondary | ICD-10-CM | POA: Diagnosis not present

## 2023-09-20 DIAGNOSIS — N3946 Mixed incontinence: Secondary | ICD-10-CM | POA: Diagnosis not present

## 2023-09-20 DIAGNOSIS — N952 Postmenopausal atrophic vaginitis: Secondary | ICD-10-CM | POA: Diagnosis not present

## 2023-09-20 DIAGNOSIS — N3000 Acute cystitis without hematuria: Secondary | ICD-10-CM | POA: Diagnosis not present

## 2023-09-25 DIAGNOSIS — I129 Hypertensive chronic kidney disease with stage 1 through stage 4 chronic kidney disease, or unspecified chronic kidney disease: Secondary | ICD-10-CM | POA: Diagnosis not present

## 2023-09-25 DIAGNOSIS — M4726 Other spondylosis with radiculopathy, lumbar region: Secondary | ICD-10-CM | POA: Diagnosis not present

## 2023-09-25 DIAGNOSIS — M48061 Spinal stenosis, lumbar region without neurogenic claudication: Secondary | ICD-10-CM | POA: Diagnosis not present

## 2023-09-25 DIAGNOSIS — N189 Chronic kidney disease, unspecified: Secondary | ICD-10-CM | POA: Diagnosis not present

## 2023-09-25 DIAGNOSIS — M1990 Unspecified osteoarthritis, unspecified site: Secondary | ICD-10-CM | POA: Diagnosis not present

## 2023-10-01 DIAGNOSIS — I129 Hypertensive chronic kidney disease with stage 1 through stage 4 chronic kidney disease, or unspecified chronic kidney disease: Secondary | ICD-10-CM | POA: Diagnosis not present

## 2023-10-01 DIAGNOSIS — M48061 Spinal stenosis, lumbar region without neurogenic claudication: Secondary | ICD-10-CM | POA: Diagnosis not present

## 2023-10-01 DIAGNOSIS — N189 Chronic kidney disease, unspecified: Secondary | ICD-10-CM | POA: Diagnosis not present

## 2023-10-01 DIAGNOSIS — M1990 Unspecified osteoarthritis, unspecified site: Secondary | ICD-10-CM | POA: Diagnosis not present

## 2023-10-01 DIAGNOSIS — M4726 Other spondylosis with radiculopathy, lumbar region: Secondary | ICD-10-CM | POA: Diagnosis not present

## 2023-10-02 DIAGNOSIS — M4726 Other spondylosis with radiculopathy, lumbar region: Secondary | ICD-10-CM | POA: Diagnosis not present

## 2023-10-02 DIAGNOSIS — Z6836 Body mass index (BMI) 36.0-36.9, adult: Secondary | ICD-10-CM | POA: Diagnosis not present

## 2023-10-02 DIAGNOSIS — N189 Chronic kidney disease, unspecified: Secondary | ICD-10-CM | POA: Diagnosis not present

## 2023-10-02 DIAGNOSIS — E7800 Pure hypercholesterolemia, unspecified: Secondary | ICD-10-CM | POA: Diagnosis not present

## 2023-10-02 DIAGNOSIS — Z9181 History of falling: Secondary | ICD-10-CM | POA: Diagnosis not present

## 2023-10-02 DIAGNOSIS — M48061 Spinal stenosis, lumbar region without neurogenic claudication: Secondary | ICD-10-CM | POA: Diagnosis not present

## 2023-10-02 DIAGNOSIS — M1990 Unspecified osteoarthritis, unspecified site: Secondary | ICD-10-CM | POA: Diagnosis not present

## 2023-10-02 DIAGNOSIS — I129 Hypertensive chronic kidney disease with stage 1 through stage 4 chronic kidney disease, or unspecified chronic kidney disease: Secondary | ICD-10-CM | POA: Diagnosis not present

## 2023-10-02 DIAGNOSIS — Z556 Problems related to health literacy: Secondary | ICD-10-CM | POA: Diagnosis not present

## 2023-10-02 DIAGNOSIS — Z87440 Personal history of urinary (tract) infections: Secondary | ICD-10-CM | POA: Diagnosis not present

## 2023-10-08 DIAGNOSIS — M4726 Other spondylosis with radiculopathy, lumbar region: Secondary | ICD-10-CM | POA: Diagnosis not present

## 2023-10-08 DIAGNOSIS — N189 Chronic kidney disease, unspecified: Secondary | ICD-10-CM | POA: Diagnosis not present

## 2023-10-08 DIAGNOSIS — M1990 Unspecified osteoarthritis, unspecified site: Secondary | ICD-10-CM | POA: Diagnosis not present

## 2023-10-08 DIAGNOSIS — I129 Hypertensive chronic kidney disease with stage 1 through stage 4 chronic kidney disease, or unspecified chronic kidney disease: Secondary | ICD-10-CM | POA: Diagnosis not present

## 2023-10-08 DIAGNOSIS — M48061 Spinal stenosis, lumbar region without neurogenic claudication: Secondary | ICD-10-CM | POA: Diagnosis not present

## 2023-10-12 DIAGNOSIS — N3946 Mixed incontinence: Secondary | ICD-10-CM | POA: Diagnosis not present

## 2023-10-12 DIAGNOSIS — N952 Postmenopausal atrophic vaginitis: Secondary | ICD-10-CM | POA: Diagnosis not present

## 2023-10-12 DIAGNOSIS — N3000 Acute cystitis without hematuria: Secondary | ICD-10-CM | POA: Diagnosis not present

## 2023-10-12 DIAGNOSIS — R8271 Bacteriuria: Secondary | ICD-10-CM | POA: Diagnosis not present

## 2023-10-15 DIAGNOSIS — I129 Hypertensive chronic kidney disease with stage 1 through stage 4 chronic kidney disease, or unspecified chronic kidney disease: Secondary | ICD-10-CM | POA: Diagnosis not present

## 2023-10-15 DIAGNOSIS — M4726 Other spondylosis with radiculopathy, lumbar region: Secondary | ICD-10-CM | POA: Diagnosis not present

## 2023-10-15 DIAGNOSIS — M1990 Unspecified osteoarthritis, unspecified site: Secondary | ICD-10-CM | POA: Diagnosis not present

## 2023-10-15 DIAGNOSIS — N189 Chronic kidney disease, unspecified: Secondary | ICD-10-CM | POA: Diagnosis not present

## 2023-10-15 DIAGNOSIS — M48061 Spinal stenosis, lumbar region without neurogenic claudication: Secondary | ICD-10-CM | POA: Diagnosis not present

## 2023-10-20 DIAGNOSIS — M4726 Other spondylosis with radiculopathy, lumbar region: Secondary | ICD-10-CM | POA: Diagnosis not present

## 2023-10-20 DIAGNOSIS — M48061 Spinal stenosis, lumbar region without neurogenic claudication: Secondary | ICD-10-CM | POA: Diagnosis not present

## 2023-10-20 DIAGNOSIS — N189 Chronic kidney disease, unspecified: Secondary | ICD-10-CM | POA: Diagnosis not present

## 2023-10-20 DIAGNOSIS — I129 Hypertensive chronic kidney disease with stage 1 through stage 4 chronic kidney disease, or unspecified chronic kidney disease: Secondary | ICD-10-CM | POA: Diagnosis not present

## 2023-10-20 DIAGNOSIS — M1990 Unspecified osteoarthritis, unspecified site: Secondary | ICD-10-CM | POA: Diagnosis not present

## 2023-10-28 DIAGNOSIS — M1990 Unspecified osteoarthritis, unspecified site: Secondary | ICD-10-CM | POA: Diagnosis not present

## 2023-10-28 DIAGNOSIS — N189 Chronic kidney disease, unspecified: Secondary | ICD-10-CM | POA: Diagnosis not present

## 2023-10-28 DIAGNOSIS — I129 Hypertensive chronic kidney disease with stage 1 through stage 4 chronic kidney disease, or unspecified chronic kidney disease: Secondary | ICD-10-CM | POA: Diagnosis not present

## 2023-10-28 DIAGNOSIS — M4726 Other spondylosis with radiculopathy, lumbar region: Secondary | ICD-10-CM | POA: Diagnosis not present

## 2023-10-28 DIAGNOSIS — M48061 Spinal stenosis, lumbar region without neurogenic claudication: Secondary | ICD-10-CM | POA: Diagnosis not present

## 2023-11-01 DIAGNOSIS — Z7984 Long term (current) use of oral hypoglycemic drugs: Secondary | ICD-10-CM | POA: Diagnosis not present

## 2023-11-01 DIAGNOSIS — M4726 Other spondylosis with radiculopathy, lumbar region: Secondary | ICD-10-CM | POA: Diagnosis not present

## 2023-11-01 DIAGNOSIS — M48061 Spinal stenosis, lumbar region without neurogenic claudication: Secondary | ICD-10-CM | POA: Diagnosis not present

## 2023-11-01 DIAGNOSIS — M1990 Unspecified osteoarthritis, unspecified site: Secondary | ICD-10-CM | POA: Diagnosis not present

## 2023-11-01 DIAGNOSIS — Z87440 Personal history of urinary (tract) infections: Secondary | ICD-10-CM | POA: Diagnosis not present

## 2023-11-01 DIAGNOSIS — Z6836 Body mass index (BMI) 36.0-36.9, adult: Secondary | ICD-10-CM | POA: Diagnosis not present

## 2023-11-01 DIAGNOSIS — Z556 Problems related to health literacy: Secondary | ICD-10-CM | POA: Diagnosis not present

## 2023-11-01 DIAGNOSIS — I129 Hypertensive chronic kidney disease with stage 1 through stage 4 chronic kidney disease, or unspecified chronic kidney disease: Secondary | ICD-10-CM | POA: Diagnosis not present

## 2023-11-01 DIAGNOSIS — Z9181 History of falling: Secondary | ICD-10-CM | POA: Diagnosis not present

## 2023-11-01 DIAGNOSIS — N189 Chronic kidney disease, unspecified: Secondary | ICD-10-CM | POA: Diagnosis not present

## 2023-11-01 DIAGNOSIS — E7800 Pure hypercholesterolemia, unspecified: Secondary | ICD-10-CM | POA: Diagnosis not present

## 2023-11-04 DIAGNOSIS — M4726 Other spondylosis with radiculopathy, lumbar region: Secondary | ICD-10-CM | POA: Diagnosis not present

## 2023-11-04 DIAGNOSIS — M48061 Spinal stenosis, lumbar region without neurogenic claudication: Secondary | ICD-10-CM | POA: Diagnosis not present

## 2023-11-04 DIAGNOSIS — I129 Hypertensive chronic kidney disease with stage 1 through stage 4 chronic kidney disease, or unspecified chronic kidney disease: Secondary | ICD-10-CM | POA: Diagnosis not present

## 2023-11-04 DIAGNOSIS — M1990 Unspecified osteoarthritis, unspecified site: Secondary | ICD-10-CM | POA: Diagnosis not present

## 2023-11-04 DIAGNOSIS — N189 Chronic kidney disease, unspecified: Secondary | ICD-10-CM | POA: Diagnosis not present

## 2023-11-10 DIAGNOSIS — Z79899 Other long term (current) drug therapy: Secondary | ICD-10-CM | POA: Diagnosis not present

## 2023-11-10 DIAGNOSIS — E785 Hyperlipidemia, unspecified: Secondary | ICD-10-CM | POA: Diagnosis not present

## 2023-11-10 DIAGNOSIS — E1129 Type 2 diabetes mellitus with other diabetic kidney complication: Secondary | ICD-10-CM | POA: Diagnosis not present

## 2023-11-10 DIAGNOSIS — I10 Essential (primary) hypertension: Secondary | ICD-10-CM | POA: Diagnosis not present

## 2023-11-10 DIAGNOSIS — N390 Urinary tract infection, site not specified: Secondary | ICD-10-CM | POA: Diagnosis not present

## 2023-11-12 DIAGNOSIS — I129 Hypertensive chronic kidney disease with stage 1 through stage 4 chronic kidney disease, or unspecified chronic kidney disease: Secondary | ICD-10-CM | POA: Diagnosis not present

## 2023-11-12 DIAGNOSIS — N189 Chronic kidney disease, unspecified: Secondary | ICD-10-CM | POA: Diagnosis not present

## 2023-11-12 DIAGNOSIS — M48061 Spinal stenosis, lumbar region without neurogenic claudication: Secondary | ICD-10-CM | POA: Diagnosis not present

## 2023-11-12 DIAGNOSIS — M1990 Unspecified osteoarthritis, unspecified site: Secondary | ICD-10-CM | POA: Diagnosis not present

## 2023-11-12 DIAGNOSIS — M4726 Other spondylosis with radiculopathy, lumbar region: Secondary | ICD-10-CM | POA: Diagnosis not present

## 2023-11-17 DIAGNOSIS — G4700 Insomnia, unspecified: Secondary | ICD-10-CM | POA: Diagnosis not present

## 2023-11-17 DIAGNOSIS — E1129 Type 2 diabetes mellitus with other diabetic kidney complication: Secondary | ICD-10-CM | POA: Diagnosis not present

## 2023-11-17 DIAGNOSIS — N1831 Chronic kidney disease, stage 3a: Secondary | ICD-10-CM | POA: Diagnosis not present

## 2023-11-17 DIAGNOSIS — I10 Essential (primary) hypertension: Secondary | ICD-10-CM | POA: Diagnosis not present

## 2023-11-23 DIAGNOSIS — R350 Frequency of micturition: Secondary | ICD-10-CM | POA: Diagnosis not present

## 2023-11-23 DIAGNOSIS — N3946 Mixed incontinence: Secondary | ICD-10-CM | POA: Diagnosis not present

## 2023-11-23 DIAGNOSIS — N952 Postmenopausal atrophic vaginitis: Secondary | ICD-10-CM | POA: Diagnosis not present

## 2023-11-23 DIAGNOSIS — N3000 Acute cystitis without hematuria: Secondary | ICD-10-CM | POA: Diagnosis not present

## 2023-11-25 DIAGNOSIS — N189 Chronic kidney disease, unspecified: Secondary | ICD-10-CM | POA: Diagnosis not present

## 2023-11-25 DIAGNOSIS — M1990 Unspecified osteoarthritis, unspecified site: Secondary | ICD-10-CM | POA: Diagnosis not present

## 2023-11-25 DIAGNOSIS — M4726 Other spondylosis with radiculopathy, lumbar region: Secondary | ICD-10-CM | POA: Diagnosis not present

## 2023-11-25 DIAGNOSIS — M48061 Spinal stenosis, lumbar region without neurogenic claudication: Secondary | ICD-10-CM | POA: Diagnosis not present

## 2023-11-25 DIAGNOSIS — I129 Hypertensive chronic kidney disease with stage 1 through stage 4 chronic kidney disease, or unspecified chronic kidney disease: Secondary | ICD-10-CM | POA: Diagnosis not present

## 2023-11-29 DIAGNOSIS — M1990 Unspecified osteoarthritis, unspecified site: Secondary | ICD-10-CM | POA: Diagnosis not present

## 2023-11-29 DIAGNOSIS — I129 Hypertensive chronic kidney disease with stage 1 through stage 4 chronic kidney disease, or unspecified chronic kidney disease: Secondary | ICD-10-CM | POA: Diagnosis not present

## 2023-11-29 DIAGNOSIS — M48061 Spinal stenosis, lumbar region without neurogenic claudication: Secondary | ICD-10-CM | POA: Diagnosis not present

## 2023-11-29 DIAGNOSIS — N189 Chronic kidney disease, unspecified: Secondary | ICD-10-CM | POA: Diagnosis not present

## 2023-11-29 DIAGNOSIS — M4726 Other spondylosis with radiculopathy, lumbar region: Secondary | ICD-10-CM | POA: Diagnosis not present

## 2023-12-01 DIAGNOSIS — Z9181 History of falling: Secondary | ICD-10-CM | POA: Diagnosis not present

## 2023-12-01 DIAGNOSIS — N189 Chronic kidney disease, unspecified: Secondary | ICD-10-CM | POA: Diagnosis not present

## 2023-12-01 DIAGNOSIS — M48061 Spinal stenosis, lumbar region without neurogenic claudication: Secondary | ICD-10-CM | POA: Diagnosis not present

## 2023-12-01 DIAGNOSIS — E7800 Pure hypercholesterolemia, unspecified: Secondary | ICD-10-CM | POA: Diagnosis not present

## 2023-12-01 DIAGNOSIS — Z87440 Personal history of urinary (tract) infections: Secondary | ICD-10-CM | POA: Diagnosis not present

## 2023-12-01 DIAGNOSIS — I129 Hypertensive chronic kidney disease with stage 1 through stage 4 chronic kidney disease, or unspecified chronic kidney disease: Secondary | ICD-10-CM | POA: Diagnosis not present

## 2023-12-01 DIAGNOSIS — Z6836 Body mass index (BMI) 36.0-36.9, adult: Secondary | ICD-10-CM | POA: Diagnosis not present

## 2023-12-01 DIAGNOSIS — Z7984 Long term (current) use of oral hypoglycemic drugs: Secondary | ICD-10-CM | POA: Diagnosis not present

## 2023-12-01 DIAGNOSIS — M1990 Unspecified osteoarthritis, unspecified site: Secondary | ICD-10-CM | POA: Diagnosis not present

## 2023-12-01 DIAGNOSIS — M4726 Other spondylosis with radiculopathy, lumbar region: Secondary | ICD-10-CM | POA: Diagnosis not present

## 2023-12-01 DIAGNOSIS — Z556 Problems related to health literacy: Secondary | ICD-10-CM | POA: Diagnosis not present

## 2023-12-10 DIAGNOSIS — N189 Chronic kidney disease, unspecified: Secondary | ICD-10-CM | POA: Diagnosis not present

## 2023-12-10 DIAGNOSIS — M1990 Unspecified osteoarthritis, unspecified site: Secondary | ICD-10-CM | POA: Diagnosis not present

## 2023-12-10 DIAGNOSIS — M4726 Other spondylosis with radiculopathy, lumbar region: Secondary | ICD-10-CM | POA: Diagnosis not present

## 2023-12-10 DIAGNOSIS — M48061 Spinal stenosis, lumbar region without neurogenic claudication: Secondary | ICD-10-CM | POA: Diagnosis not present

## 2023-12-10 DIAGNOSIS — I129 Hypertensive chronic kidney disease with stage 1 through stage 4 chronic kidney disease, or unspecified chronic kidney disease: Secondary | ICD-10-CM | POA: Diagnosis not present

## 2023-12-16 DIAGNOSIS — M1990 Unspecified osteoarthritis, unspecified site: Secondary | ICD-10-CM | POA: Diagnosis not present

## 2023-12-16 DIAGNOSIS — I129 Hypertensive chronic kidney disease with stage 1 through stage 4 chronic kidney disease, or unspecified chronic kidney disease: Secondary | ICD-10-CM | POA: Diagnosis not present

## 2023-12-16 DIAGNOSIS — M4726 Other spondylosis with radiculopathy, lumbar region: Secondary | ICD-10-CM | POA: Diagnosis not present

## 2023-12-16 DIAGNOSIS — M48061 Spinal stenosis, lumbar region without neurogenic claudication: Secondary | ICD-10-CM | POA: Diagnosis not present

## 2023-12-16 DIAGNOSIS — N189 Chronic kidney disease, unspecified: Secondary | ICD-10-CM | POA: Diagnosis not present

## 2023-12-20 DIAGNOSIS — N189 Chronic kidney disease, unspecified: Secondary | ICD-10-CM | POA: Diagnosis not present

## 2023-12-20 DIAGNOSIS — M48061 Spinal stenosis, lumbar region without neurogenic claudication: Secondary | ICD-10-CM | POA: Diagnosis not present

## 2023-12-20 DIAGNOSIS — M1990 Unspecified osteoarthritis, unspecified site: Secondary | ICD-10-CM | POA: Diagnosis not present

## 2023-12-20 DIAGNOSIS — M4726 Other spondylosis with radiculopathy, lumbar region: Secondary | ICD-10-CM | POA: Diagnosis not present

## 2023-12-20 DIAGNOSIS — I129 Hypertensive chronic kidney disease with stage 1 through stage 4 chronic kidney disease, or unspecified chronic kidney disease: Secondary | ICD-10-CM | POA: Diagnosis not present

## 2023-12-27 DIAGNOSIS — M1990 Unspecified osteoarthritis, unspecified site: Secondary | ICD-10-CM | POA: Diagnosis not present

## 2023-12-27 DIAGNOSIS — M48061 Spinal stenosis, lumbar region without neurogenic claudication: Secondary | ICD-10-CM | POA: Diagnosis not present

## 2023-12-27 DIAGNOSIS — M4726 Other spondylosis with radiculopathy, lumbar region: Secondary | ICD-10-CM | POA: Diagnosis not present

## 2023-12-27 DIAGNOSIS — I129 Hypertensive chronic kidney disease with stage 1 through stage 4 chronic kidney disease, or unspecified chronic kidney disease: Secondary | ICD-10-CM | POA: Diagnosis not present

## 2023-12-27 DIAGNOSIS — N189 Chronic kidney disease, unspecified: Secondary | ICD-10-CM | POA: Diagnosis not present

## 2024-01-04 DIAGNOSIS — N3020 Other chronic cystitis without hematuria: Secondary | ICD-10-CM | POA: Diagnosis not present

## 2024-01-04 DIAGNOSIS — N3946 Mixed incontinence: Secondary | ICD-10-CM | POA: Diagnosis not present

## 2024-01-04 DIAGNOSIS — N952 Postmenopausal atrophic vaginitis: Secondary | ICD-10-CM | POA: Diagnosis not present

## 2024-01-10 ENCOUNTER — Emergency Department (HOSPITAL_COMMUNITY)
Admission: EM | Admit: 2024-01-10 | Discharge: 2024-01-10 | Disposition: A | Discharge status: Home / Self Care | Attending: Emergency Medicine | Admitting: Emergency Medicine

## 2024-01-10 ENCOUNTER — Encounter (HOSPITAL_COMMUNITY): Payer: Self-pay

## 2024-01-10 ENCOUNTER — Emergency Department (HOSPITAL_COMMUNITY)

## 2024-01-10 ENCOUNTER — Other Ambulatory Visit: Payer: Self-pay

## 2024-01-10 DIAGNOSIS — N189 Chronic kidney disease, unspecified: Secondary | ICD-10-CM | POA: Insufficient documentation

## 2024-01-10 DIAGNOSIS — R531 Weakness: Secondary | ICD-10-CM | POA: Diagnosis not present

## 2024-01-10 DIAGNOSIS — Z7984 Long term (current) use of oral hypoglycemic drugs: Secondary | ICD-10-CM | POA: Diagnosis not present

## 2024-01-10 DIAGNOSIS — Z7982 Long term (current) use of aspirin: Secondary | ICD-10-CM | POA: Insufficient documentation

## 2024-01-10 DIAGNOSIS — R0989 Other specified symptoms and signs involving the circulatory and respiratory systems: Secondary | ICD-10-CM | POA: Diagnosis not present

## 2024-01-10 DIAGNOSIS — J40 Bronchitis, not specified as acute or chronic: Secondary | ICD-10-CM | POA: Insufficient documentation

## 2024-01-10 DIAGNOSIS — R059 Cough, unspecified: Secondary | ICD-10-CM | POA: Diagnosis present

## 2024-01-10 LAB — CBC WITH DIFFERENTIAL/PLATELET
Abs Immature Granulocytes: 0.03 K/uL (ref 0.00–0.07)
Basophils Absolute: 0 K/uL (ref 0.0–0.1)
Basophils Relative: 1 %
Eosinophils Absolute: 0.2 K/uL (ref 0.0–0.5)
Eosinophils Relative: 3 %
HCT: 45.4 % (ref 36.0–46.0)
Hemoglobin: 15 g/dL (ref 12.0–15.0)
Immature Granulocytes: 0 %
Lymphocytes Relative: 18 %
Lymphs Abs: 1.3 K/uL (ref 0.7–4.0)
MCH: 31.7 pg (ref 26.0–34.0)
MCHC: 33 g/dL (ref 30.0–36.0)
MCV: 96 fL (ref 80.0–100.0)
Monocytes Absolute: 0.4 K/uL (ref 0.1–1.0)
Monocytes Relative: 6 %
Neutro Abs: 5.3 K/uL (ref 1.7–7.7)
Neutrophils Relative %: 72 %
Platelets: 303 K/uL (ref 150–400)
RBC: 4.73 MIL/uL (ref 3.87–5.11)
RDW: 14.6 % (ref 11.5–15.5)
WBC: 7.3 K/uL (ref 4.0–10.5)
nRBC: 0 % (ref 0.0–0.2)

## 2024-01-10 LAB — COMPREHENSIVE METABOLIC PANEL WITH GFR
ALT: 14 U/L (ref 0–44)
AST: 20 U/L (ref 15–41)
Albumin: 3.5 g/dL (ref 3.5–5.0)
Alkaline Phosphatase: 50 U/L (ref 38–126)
Anion gap: 11 (ref 5–15)
BUN: 13 mg/dL (ref 8–23)
CO2: 29 mmol/L (ref 22–32)
Calcium: 9.2 mg/dL (ref 8.9–10.3)
Chloride: 103 mmol/L (ref 98–111)
Creatinine, Ser: 1.26 mg/dL — ABNORMAL HIGH (ref 0.44–1.00)
GFR, Estimated: 42 mL/min — ABNORMAL LOW (ref >60)
Glucose, Bld: 80 mg/dL (ref 70–99)
Potassium: 3.6 mmol/L (ref 3.5–5.1)
Sodium: 143 mmol/L (ref 135–145)
Total Bilirubin: 1.3 mg/dL — ABNORMAL HIGH (ref 0.0–1.2)
Total Protein: 6 g/dL — ABNORMAL LOW (ref 6.5–8.1)

## 2024-01-10 LAB — RESP PANEL BY RT-PCR (RSV, FLU A&B, COVID)  RVPGX2
Influenza A by PCR: NEGATIVE
Influenza B by PCR: NEGATIVE
Resp Syncytial Virus by PCR: NEGATIVE
SARS Coronavirus 2 by RT PCR: NEGATIVE

## 2024-01-10 MED ORDER — SODIUM CHLORIDE 0.9 % IV BOLUS
1000.0000 mL | Freq: Once | INTRAVENOUS | Status: AC
  Administered 2024-01-10: 1000 mL via INTRAVENOUS

## 2024-01-10 MED ORDER — PREDNISONE 20 MG PO TABS: 40.0000 mg | ORAL_TABLET | Freq: Every day | ORAL | 0 refills | Status: AC

## 2024-01-10 MED ORDER — DOXYCYCLINE HYCLATE 100 MG PO CAPS: 100.0000 mg | ORAL_CAPSULE | Freq: Two times a day (BID) | ORAL | 0 refills | Status: AC

## 2024-01-10 MED ORDER — DOXYCYCLINE HYCLATE 100 MG PO TABS
100.0000 mg | ORAL_TABLET | Freq: Once | ORAL | Status: AC
  Administered 2024-01-10: 100 mg via ORAL
  Filled 2024-01-10: qty 1

## 2024-01-10 MED ORDER — PREDNISONE 50 MG PO TABS
60.0000 mg | ORAL_TABLET | ORAL | Status: AC
  Administered 2024-01-10: 60 mg via ORAL
  Filled 2024-01-10: qty 1

## 2024-01-10 NOTE — ED Triage Notes (Signed)
 Pt arrived via POV from home c/o on-going congestion X 3 weeks, and reports increased weakness, decreased appetite. Pt reports her PCP Dr Sheryle prescribed her a Z-Pack w/o relief. Pt also reports OTC medications have not helped either.

## 2024-01-10 NOTE — ED Provider Notes (Signed)
 [place] EMERGENCY DEPARTMENT AT [facility] Provider Note   CSN: [id number] Arrival date & time: 01/10/24  1654     Patient presents with: Nasal Congestion   Kimberly Miranda is a 83 y.o. female.   HPI Patient presents with daughter who assists with the history.  Patient presents with concern of ongoing cough, congestion, as well as generalized fatigue. Patient has been seen, evaluated, has completed a course of antibiotics about 1 week ago has persistent upper chest congestion, discomfort, without true pain.  No fever, no vomiting, no fall.  No reported changes in cognition.    Prior to Admission medications   Medication Sig Start Date End Date Taking? Authorizing Provider  doxycycline  (VIBRAMYCIN ) 100 MG capsule Take 1 capsule (100 mg total) by mouth 2 (two) times daily for 7 days. 01/10/24 01/17/24 Yes Garrick Charleston, MD  predniSONE  (DELTASONE ) 20 MG tablet Take 2 tablets (40 mg total) by mouth daily with breakfast. For the next four days 01/10/24  Yes Garrick Charleston, MD  aspirin  EC 81 MG tablet Take 81 mg by mouth daily.    [provider]  estradiol (ESTRACE) 0.1 MG/GM vaginal cream Place 1 Applicatorful vaginally 2 (two) times a week.    [provider]  furosemide (LASIX) 40 MG tablet Take 20 mg by mouth daily as needed for edema.     [provider]  hydrochlorothiazide  (HYDRODIURIL ) 25 MG tablet Take 25 mg by mouth daily. 08/15/13   [provider]  metFORMIN (GLUCOPHAGE) 500 MG tablet Take 500 mg by mouth 2 (two) times daily with a meal.  09/06/18   [provider]  metoprolol succinate (TOPROL-XL) 25 MG 24 hr tablet Take 25 mg by mouth daily. 11/11/19   [provider]  ondansetron  (ZOFRAN  ODT) 4 MG disintegrating tablet Take 1 tablet (4 mg total) by mouth every 8 (eight) hours as needed for nausea or vomiting. 06/14/22   Patsey Lot, MD  pantoprazole  (PROTONIX ) 40 MG tablet Take 40 mg by mouth daily.    [provider]  ramipril  (ALTACE ) 10 MG capsule Take 2 capsules (20 mg total) by mouth daily. Patient taking differently: Take 10 mg by mouth daily.  01/08/17   Theophilus Andrews, Tully GRADE, MD  simvastatin  (ZOCOR ) 20 MG tablet Take 20 mg by mouth daily.     [provider]  spironolactone (ALDACTONE) 25 MG tablet Take 25 mg by mouth daily.  09/06/18   [provider]    Allergies: Patient has no known allergies.    Review of Systems  Updated Vital Signs BP (!) 147/62 (BP Location: Left Wrist)   Pulse 71   Temp 98.1 F (36.7 C) (Oral)   Resp 19   Ht 1.6 m (5' 3)   Wt 98 kg   SpO2 96%   BMI 38.27 kg/m   Physical Exam Vitals and nursing note reviewed.  Constitutional:      General: She is not in acute distress.    Appearance: She is well-developed.  HENT:     Head: Normocephalic and atraumatic.  Eyes:     Conjunctiva/sclera: Conjunctivae normal.  Cardiovascular:     Rate and Rhythm: Normal rate and regular rhythm.  Pulmonary:     Effort: Pulmonary effort is normal. No respiratory distress.     Breath sounds: Normal breath sounds. No stridor.  Abdominal:     General: There is no distension.  Skin:    General: Skin is warm and dry.  Neurological:  Mental Status: She is alert and oriented to person, place, and time.     Cranial Nerves: No cranial nerve deficit.  Psychiatric:        Mood and Affect: Mood normal.     (all labs ordered are listed, but only abnormal results are displayed) Labs Reviewed  COMPREHENSIVE METABOLIC PANEL WITH GFR - Abnormal; Notable for the following components:      Result Value   Creatinine, Ser 1.26 (*)    Total Protein 6.0 (*)    Total Bilirubin 1.3 (*)    GFR, Estimated 42 (*)    All other components within normal limits  RESP PANEL BY RT-PCR (RSV, FLU A&B, COVID)  [id number]  CBC WITH DIFFERENTIAL/PLATELET    EKG: None  Radiology: DG Chest 2 View Result Date: 01/10/2024 CLINICAL DATA:  Weakness, congestion EXAM:  CHEST - 2 VIEW COMPARISON:  06/14/2022 FINDINGS: Heart and mediastinal contours are within normal limits. No focal opacities or effusions. No acute bony abnormality. IMPRESSION: No active cardiopulmonary disease. Electronically Signed   By: Franky Crease M.D.   On: 01/10/2024 18:15     Procedures   Medications Ordered in the ED  doxycycline  (VIBRA -TABS) tablet 100 mg (has no administration in time range)  predniSONE  (DELTASONE ) tablet 60 mg (has no administration in time range)  sodium chloride  0.9 % bolus 1,000 mL (0 mLs Intravenous Stopped 01/10/24 2105)                                    Medical Decision Making Elderly female presents with ongoing cough, congestion, as well as weakness. Patient is awake, alert, hemodynamically unremarkable here. Cardiac 70 sinus normal pulse ox 90% room air normal broad differential including pneumonia, bronchitis, dehydration, electrolyte abnormalities, bacteremia, sepsis.   Amount and/or Complexity of Data Reviewed Independent Historian: caregiver External Data Reviewed: notes.    Details: CKD with morbid obesity on chart review. Labs: ordered. Decision-making details documented in ED Course. Radiology: ordered and independent interpretation performed. Decision-making details documented in ED Course. ECG/medicine tests: ordered and independent interpretation performed. Decision-making details documented in ED Course.  Risk Prescription drug management. Decision regarding hospitalization. Diagnosis or treatment significantly limited by social determinants of health.  COVID-negative 9:34 PM Patient in no distress, hemodynamically unremarkable, we discussed all findings, no pneumonia on x-ray, labs consistent with dehydration.  Patient has a history of chronic kidney disease, likely exacerbation contributed to worsening creatinine, compared to last year. She has received fluid resuscitation, has no new complaints, will start new steroids,  antibiotics with consideration of bronchitis given her recent URI with persistent cough, congestion.  Patient will follow-up with primary care.     Final diagnoses:  Bronchitis    ED Discharge Orders          Ordered    doxycycline  (VIBRAMYCIN ) 100 MG capsule  2 times daily        01/10/24 2134    predniSONE  (DELTASONE ) 20 MG tablet  Daily with breakfast        01/10/24 2134               Garrick Charleston, MD 01/10/24 2134

## 2024-01-10 NOTE — Discharge Instructions (Signed)
 As discussed, your evaluation today has been largely reassuring.  But, it is important that you monitor your condition carefully, and do not hesitate to return to the ED if you develop new, or concerning changes in your condition. ? ?Otherwise, please follow-up with your physician for appropriate ongoing care. ? ?

## 2024-02-04 DIAGNOSIS — N3020 Other chronic cystitis without hematuria: Secondary | ICD-10-CM | POA: Diagnosis not present

## 2024-02-04 DIAGNOSIS — N3941 Urge incontinence: Secondary | ICD-10-CM | POA: Diagnosis not present

## 2024-03-14 DIAGNOSIS — N3946 Mixed incontinence: Secondary | ICD-10-CM | POA: Diagnosis not present

## 2024-03-14 DIAGNOSIS — N3021 Other chronic cystitis with hematuria: Secondary | ICD-10-CM | POA: Diagnosis not present

## 2024-03-17 DIAGNOSIS — N1831 Chronic kidney disease, stage 3a: Secondary | ICD-10-CM | POA: Diagnosis not present

## 2024-03-17 DIAGNOSIS — E1129 Type 2 diabetes mellitus with other diabetic kidney complication: Secondary | ICD-10-CM | POA: Diagnosis not present

## 2024-03-24 DIAGNOSIS — I10 Essential (primary) hypertension: Secondary | ICD-10-CM | POA: Diagnosis not present

## 2024-03-24 DIAGNOSIS — E1129 Type 2 diabetes mellitus with other diabetic kidney complication: Secondary | ICD-10-CM | POA: Diagnosis not present

## 2024-03-24 DIAGNOSIS — N1831 Chronic kidney disease, stage 3a: Secondary | ICD-10-CM | POA: Diagnosis not present

## 2024-03-24 DIAGNOSIS — Z23 Encounter for immunization: Secondary | ICD-10-CM | POA: Diagnosis not present
